# Patient Record
Sex: Male | Born: 1975
Health system: Southern US, Community
[De-identification: ages and names within clinical notes are randomized; demographics above are authoritative.]

## PROBLEM LIST (undated history)

## (undated) DIAGNOSIS — N200 Calculus of kidney: Secondary | ICD-10-CM

## (undated) DIAGNOSIS — N189 Chronic kidney disease, unspecified: Secondary | ICD-10-CM

## (undated) HISTORY — PX: GALLBLADDER SURGERY: SHX652

## (undated) HISTORY — PX: CHOLECYSTECTOMY: SHX55

## (undated) HISTORY — DX: Chronic kidney disease, unspecified: N18.9

## (undated) HISTORY — DX: Calculus of kidney: N20.0

---

## 2006-07-21 ENCOUNTER — Ambulatory Visit: Payer: Self-pay | Admitting: Family Medicine

## 2006-08-04 ENCOUNTER — Ambulatory Visit: Payer: Self-pay | Admitting: General Surgery

## 2007-06-28 ENCOUNTER — Ambulatory Visit: Payer: Self-pay | Admitting: Gastroenterology

## 2010-11-25 ENCOUNTER — Emergency Department: Payer: Self-pay | Admitting: Emergency Medicine

## 2010-12-17 ENCOUNTER — Ambulatory Visit: Payer: Self-pay

## 2010-12-31 ENCOUNTER — Ambulatory Visit: Payer: Self-pay | Admitting: Urology

## 2011-11-22 IMAGING — CT CT STONE STUDY
1 of 2 series · 15 of 32 positions shown, 19 images · non-contrast
Comparison: none

REASON FOR EXAM: left flank pain with hematuria
COMMENTS:   LMP: (Male)

[Series 2: soft tissue · axial · 0.97mm/px · z∈[-544,-46]mm · 15 of 182 slices shown, 19 images]
[im 8/182  soft-tissue]
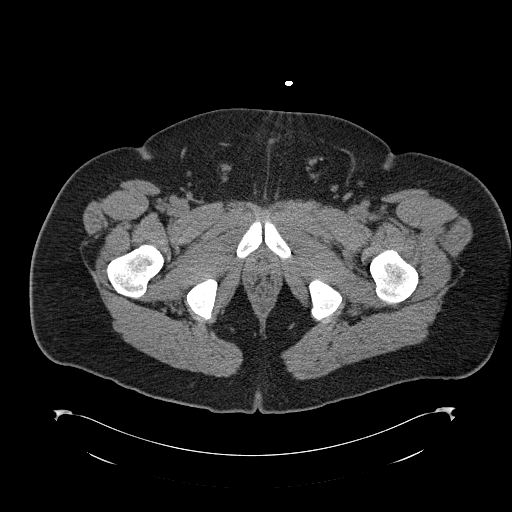
[im 8/182  bone]
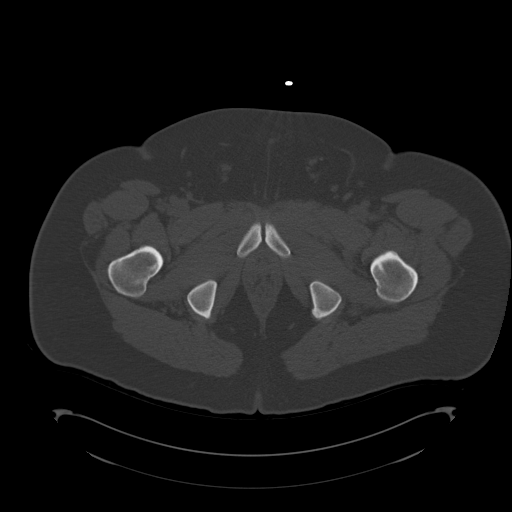
[im 24/182  soft-tissue]
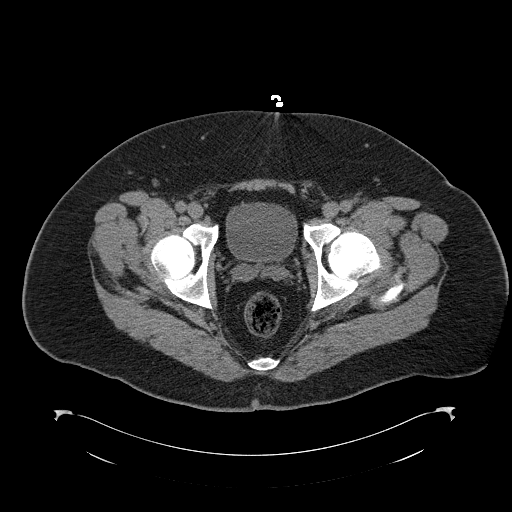
[im 40/182  soft-tissue]
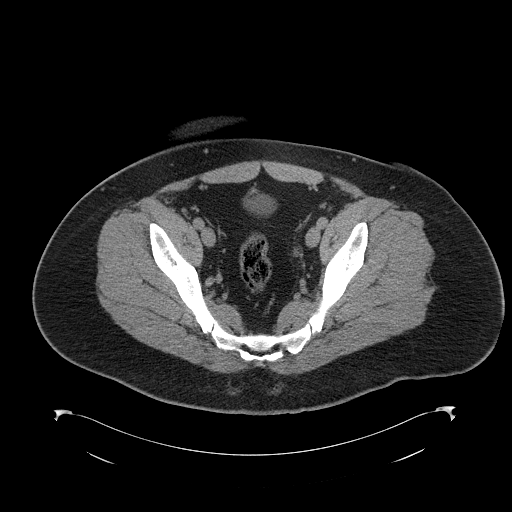
[im 48/182  soft-tissue]
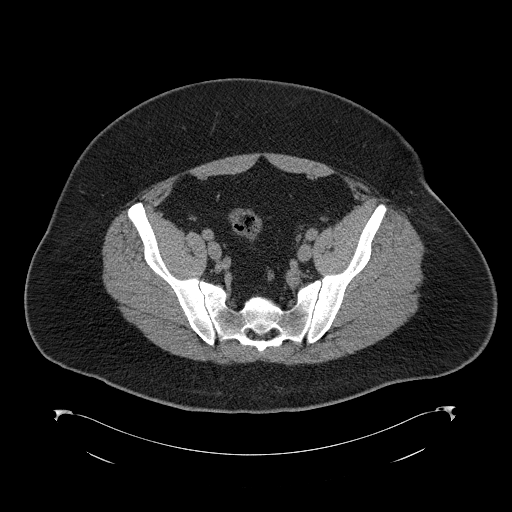
[im 63/182  soft-tissue]
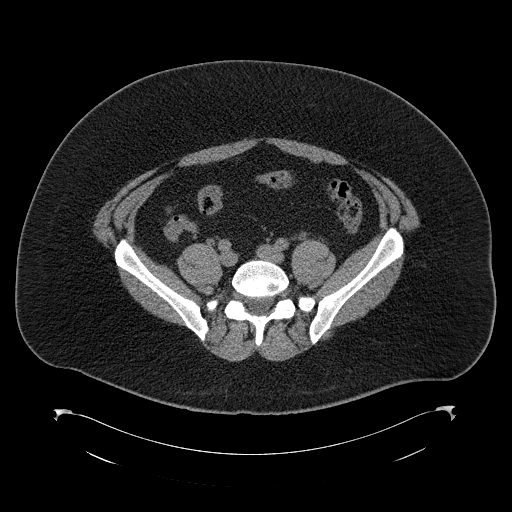
[im 79/182  soft-tissue]
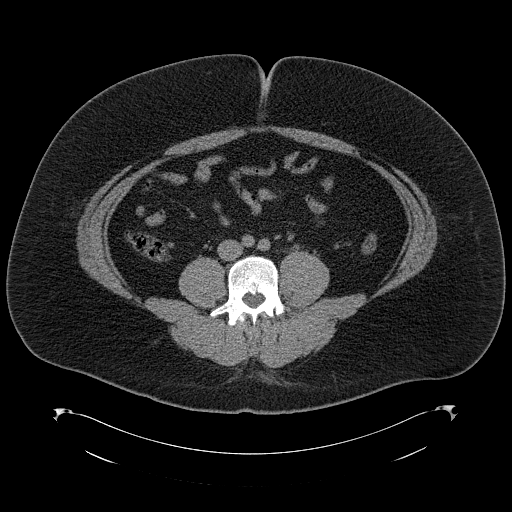
[im 95/182  soft-tissue]
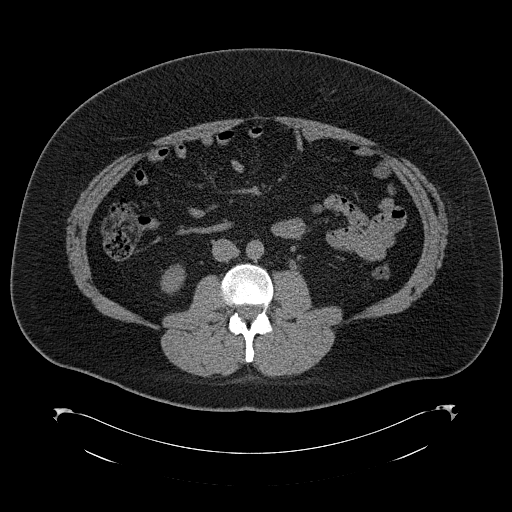
[im 103/182  soft-tissue]
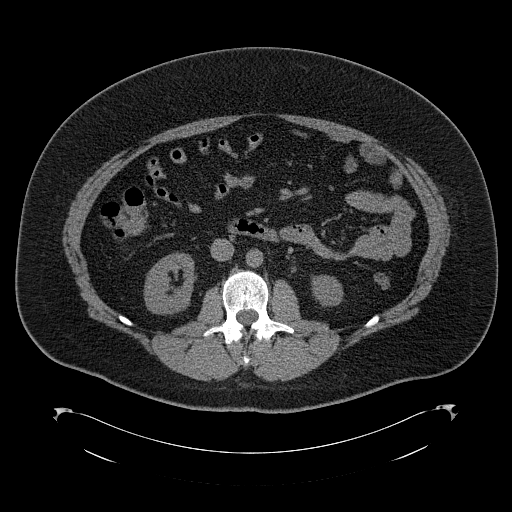
[im 119/182  soft-tissue]
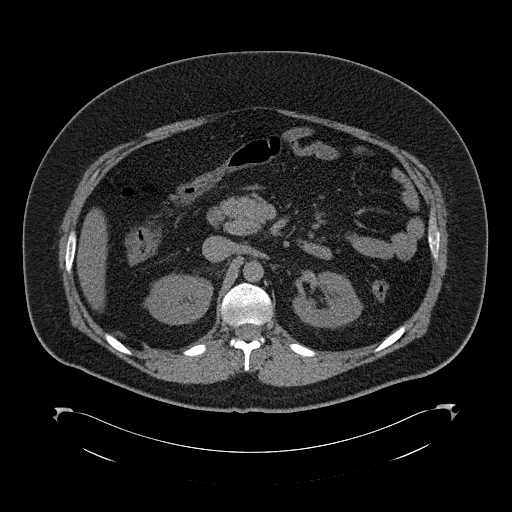
[im 119/182  bone]
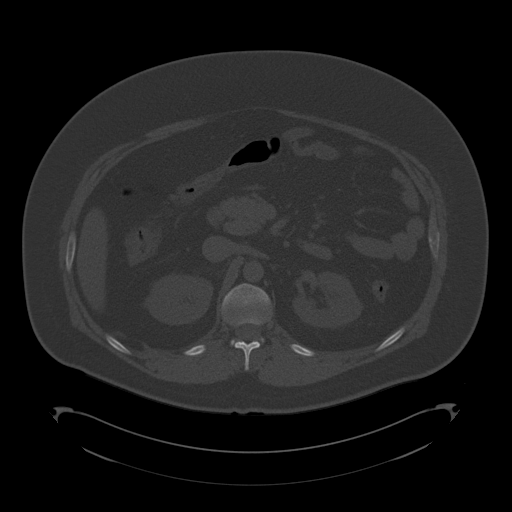
[im 134/182  soft-tissue]
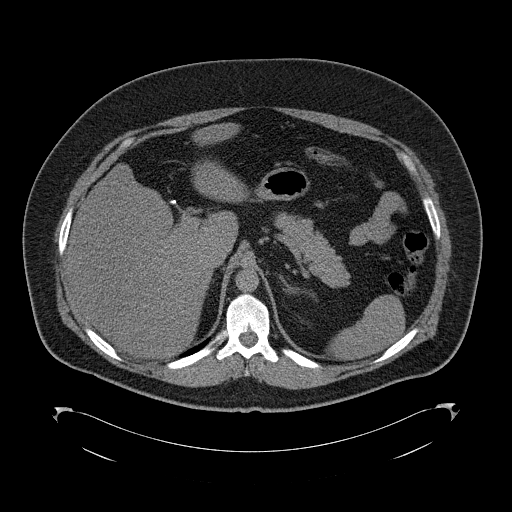
[im 142/182  soft-tissue]
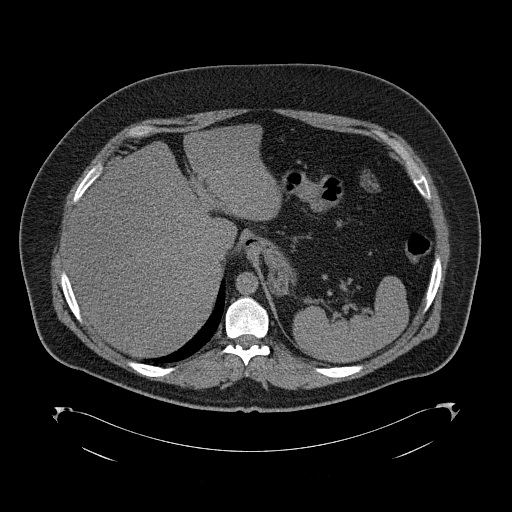
[im 150/182  lung]
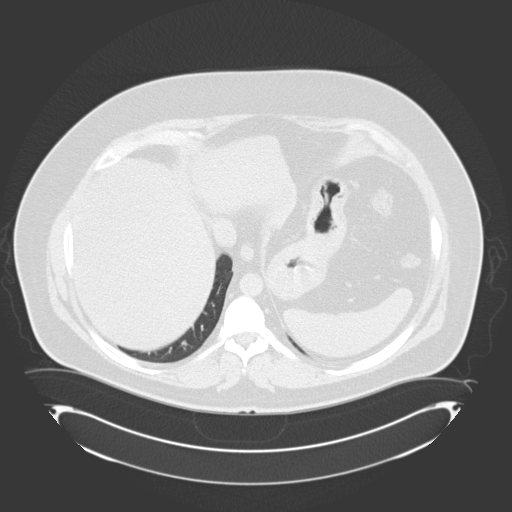
[im 158/182  soft-tissue]
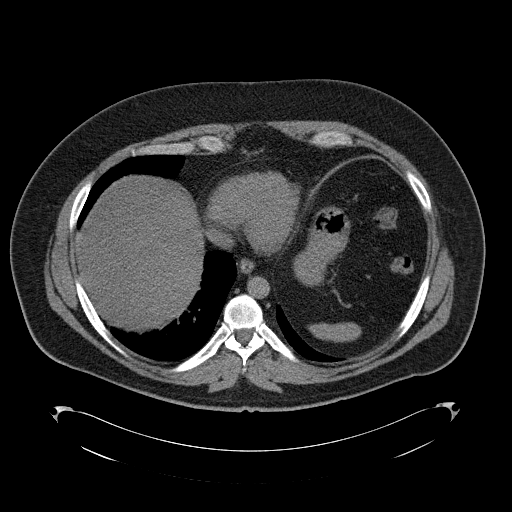
[im 158/182  lung]
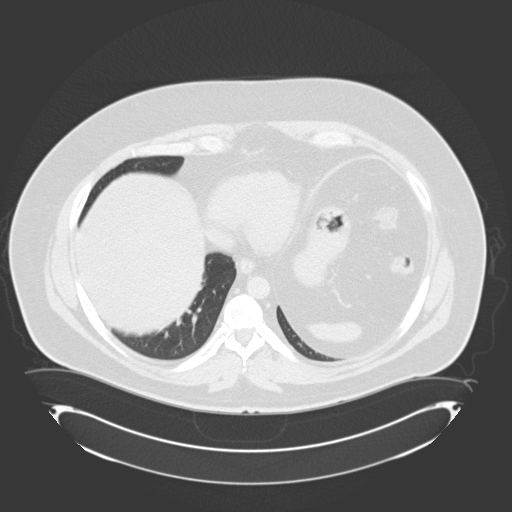
[im 166/182  lung]
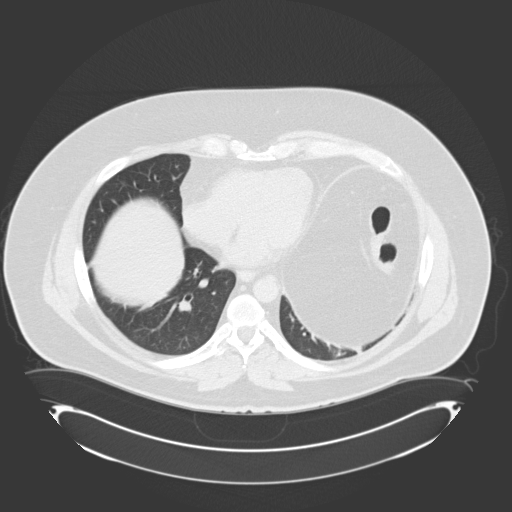
[im 174/182  soft-tissue]
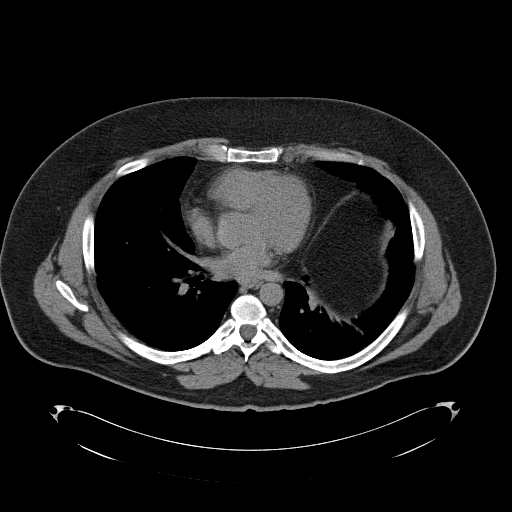
[im 174/182  lung]
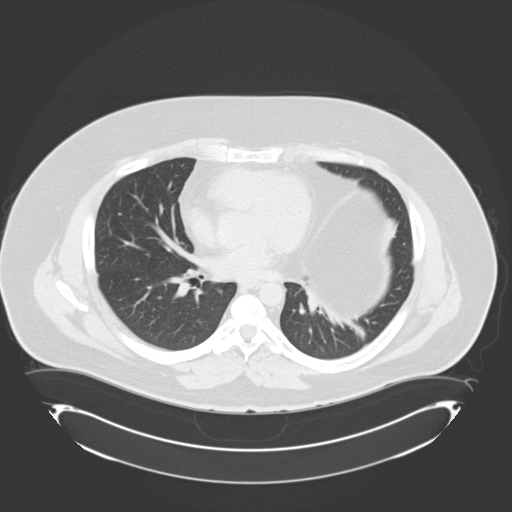

[15 of 32 positions shown; findings below may reference images not displayed]

PROCEDURE:     CT  - CT ABDOMEN /PELVIS WO (STONE)  - November 25, 2010 [DATE]

RESULT:     Axial noncontrast CT scanning was performed through the abdomen
and pelvis at 3 mm intervals and slice thicknesses. Review of multiplanar
reconstructed images was performed separately on the VIA monitor.

There is very mild hydronephrosis and hydroureter on the left secondary to a
distal ureteral stone. The stone, seen on image 149, measures 3 mm in
greatest dimension. There is a nonobstructing 2 mm diameter lower pole stone
on the left. On the right there is a 1 mm diameter nonobstructing midpole
stone. The perinephric fat is normal in density on the right and minimally
prominent on the left. The urinary bladder exhibits no evidence of stones.
The prostate gland and seminal vesicles are normal in appearance.

The gallbladder is surgically absent. The liver, pancreas, spleen, partially
distended stomach, adrenal glands, and periaortic and pericaval regions are
normal in appearance. The caliber of the abdominal aorta is normal. The
unopacified loops of small and large bowel exhibit no evidence of ileus nor
obstruction. The lumbar vertebral bodies are preserved in height. The lung
bases exhibit no acute abnormality.
IMPRESSION: 1. There is mild hydronephrosis and hydroureter on the left secondary to a 3
mm diameter distal left ureteral stone. The stone lies approximately 3 cm
from the ureterovesical junction. There are other nonobstructing stones in
both kidneys.
2. I see no acute abnormality elsewhere within the abdomen or pelvis.

## 2016-11-16 DIAGNOSIS — H1032 Unspecified acute conjunctivitis, left eye: Secondary | ICD-10-CM | POA: Diagnosis not present

## 2016-11-18 DIAGNOSIS — J019 Acute sinusitis, unspecified: Secondary | ICD-10-CM | POA: Diagnosis not present

## 2016-11-18 DIAGNOSIS — J029 Acute pharyngitis, unspecified: Secondary | ICD-10-CM | POA: Diagnosis not present

## 2016-11-21 DIAGNOSIS — B9689 Other specified bacterial agents as the cause of diseases classified elsewhere: Secondary | ICD-10-CM | POA: Diagnosis not present

## 2016-11-21 DIAGNOSIS — J019 Acute sinusitis, unspecified: Secondary | ICD-10-CM | POA: Diagnosis not present

## 2016-11-21 DIAGNOSIS — J209 Acute bronchitis, unspecified: Secondary | ICD-10-CM | POA: Diagnosis not present

## 2017-06-30 DIAGNOSIS — R05 Cough: Secondary | ICD-10-CM | POA: Diagnosis not present

## 2017-06-30 DIAGNOSIS — J019 Acute sinusitis, unspecified: Secondary | ICD-10-CM | POA: Diagnosis not present

## 2017-09-03 ENCOUNTER — Encounter: Payer: Self-pay | Admitting: Physician Assistant

## 2017-09-03 ENCOUNTER — Ambulatory Visit (INDEPENDENT_AMBULATORY_CARE_PROVIDER_SITE_OTHER): Payer: 59 | Admitting: Physician Assistant

## 2017-09-03 VITALS — BP 140/80 | HR 63 | Temp 98.3°F | Resp 16 | Ht 72.0 in | Wt 264.4 lb

## 2017-09-03 DIAGNOSIS — Z9049 Acquired absence of other specified parts of digestive tract: Secondary | ICD-10-CM | POA: Insufficient documentation

## 2017-09-03 DIAGNOSIS — Z114 Encounter for screening for human immunodeficiency virus [HIV]: Secondary | ICD-10-CM | POA: Diagnosis not present

## 2017-09-03 DIAGNOSIS — K219 Gastro-esophageal reflux disease without esophagitis: Secondary | ICD-10-CM

## 2017-09-03 DIAGNOSIS — Z87442 Personal history of urinary calculi: Secondary | ICD-10-CM | POA: Diagnosis not present

## 2017-09-03 DIAGNOSIS — Z0000 Encounter for general adult medical examination without abnormal findings: Secondary | ICD-10-CM

## 2017-09-03 DIAGNOSIS — Z13220 Encounter for screening for lipoid disorders: Secondary | ICD-10-CM | POA: Diagnosis not present

## 2017-09-03 DIAGNOSIS — Z136 Encounter for screening for cardiovascular disorders: Secondary | ICD-10-CM | POA: Diagnosis not present

## 2017-09-03 DIAGNOSIS — Z8619 Personal history of other infectious and parasitic diseases: Secondary | ICD-10-CM | POA: Insufficient documentation

## 2017-09-03 DIAGNOSIS — Z7689 Persons encountering health services in other specified circumstances: Secondary | ICD-10-CM

## 2017-09-03 DIAGNOSIS — Z125 Encounter for screening for malignant neoplasm of prostate: Secondary | ICD-10-CM | POA: Diagnosis not present

## 2017-09-03 DIAGNOSIS — Z833 Family history of diabetes mellitus: Secondary | ICD-10-CM | POA: Diagnosis not present

## 2017-09-03 MED ORDER — LANSOPRAZOLE 30 MG PO CPDR: 30.0000 mg | DELAYED_RELEASE_CAPSULE | Freq: Every day | ORAL | 1 refills | Status: DC

## 2017-09-03 NOTE — Patient Instructions (Addendum)
Tumeric 500-1000mg  daily for inflammation  Health Maintenance, Male A healthy lifestyle and preventive care is important for your health and wellness. Ask your health care provider about what schedule of regular examinations is right for you. What should I know about weight and diet? Eat a Healthy Diet  Eat plenty of vegetables, fruits, whole grains, low-fat dairy products, and lean protein.  Do not eat a lot of foods high in solid fats, added sugars, or salt.  Maintain a Healthy Weight Regular exercise can help you achieve or maintain a healthy weight. You should:  Do at least 150 minutes of exercise each week. The exercise should increase your heart rate and make you sweat (moderate-intensity exercise).  Do strength-training exercises at least twice a week.  Watch Your Levels of Cholesterol and Blood Lipids  Have your blood tested for lipids and cholesterol every 5 years starting at 44 years of age. If you are at high risk for heart disease, you should start having your blood tested when you are 44 years old. You may need to have your cholesterol levels checked more often if: ? Your lipid or cholesterol levels are high. ? You are older than 44 years of age. ? You are at high risk for heart disease.  What should I know about cancer screening? Many types of cancers can be detected early and may often be prevented. Lung Cancer  You should be screened every year for lung cancer if: ? You are a current smoker who has smoked for at least 30 years. ? You are a former smoker who has quit within the past 15 years.  Talk to your health care provider about your screening options, when you should start screening, and how often you should be screened.  Colorectal Cancer  Routine colorectal cancer screening usually begins at 44 years of age and should be repeated every 5-10 years until you are 44 years old. You may need to be screened more often if early forms of precancerous polyps or small  growths are found. Your health care provider may recommend screening at an earlier age if you have risk factors for colon cancer.  Your health care provider may recommend using home test kits to check for hidden blood in the stool.  A small camera at the end of a tube can be used to examine your colon (sigmoidoscopy or colonoscopy). This checks for the earliest forms of colorectal cancer.  Prostate and Testicular Cancer  Depending on your age and overall health, your health care provider may do certain tests to screen for prostate and testicular cancer.  Talk to your health care provider about any symptoms or concerns you have about testicular or prostate cancer.  Skin Cancer  Check your skin from head to toe regularly.  Tell your health care provider about any new moles or changes in moles, especially if: ? There is a change in a mole's size, shape, or color. ? You have a mole that is larger than a pencil eraser.  Always use sunscreen. Apply sunscreen liberally and repeat throughout the day.  Protect yourself by wearing long sleeves, pants, a wide-brimmed hat, and sunglasses when outside.  What should I know about heart disease, diabetes, and high blood pressure?  If you are 52-44 years of age, have your blood pressure checked every 3-5 years. If you are 31 years of age or older, have your blood pressure checked every year. You should have your blood pressure measured twice-once when you are at a  hospital or clinic, and once when you are not at a hospital or clinic. Record the average of the two measurements. To check your blood pressure when you are not at a hospital or clinic, you can use: ? An automated blood pressure machine at a pharmacy. ? A home blood pressure monitor.  Talk to your health care provider about your target blood pressure.  If you are between 60-56 years old, ask your health care provider if you should take aspirin to prevent heart disease.  Have regular  diabetes screenings by checking your fasting blood sugar level. ? If you are at a normal weight and have a low risk for diabetes, have this test once every three years after the age of 69. ? If you are overweight and have a high risk for diabetes, consider being tested at a younger age or more often.  A one-time screening for abdominal aortic aneurysm (AAA) by ultrasound is recommended for men aged 69-75 years who are current or former smokers. What should I know about preventing infection? Hepatitis B If you have a higher risk for hepatitis B, you should be screened for this virus. Talk with your health care provider to find out if you are at risk for hepatitis B infection. Hepatitis C Blood testing is recommended for:  Everyone born from [birth year] through 1967.  Anyone with known risk factors for hepatitis C.  Sexually Transmitted Diseases (STDs)  You should be screened each year for STDs including gonorrhea and chlamydia if: ? You are sexually active and are younger than 44 years of age. ? You are older than 44 years of age and your health care provider tells you that you are at risk for this type of infection. ? Your sexual activity has changed since you were last screened and you are at an increased risk for chlamydia or gonorrhea. Ask your health care provider if you are at risk.  Talk with your health care provider about whether you are at high risk of being infected with HIV. Your health care provider may recommend a prescription medicine to help prevent HIV infection.  What else can I do?  Schedule regular health, dental, and eye exams.  Stay current with your vaccines (immunizations).  Do not use any tobacco products, such as cigarettes, chewing tobacco, and e-cigarettes. If you need help quitting, ask your health care provider.  Limit alcohol intake to no more than 2 drinks per day. One drink equals 12 ounces of beer, 5 ounces of wine, or 1 ounces of hard liquor.  Do not use  street drugs.  Do not share needles.  Ask your health care provider for help if you need support or information about quitting drugs.  Tell your health care provider if you often feel depressed.  Tell your health care provider if you have ever been abused or do not feel safe at home. This information is not intended to replace advice given to you by your health care provider. Make sure you discuss any questions you have with your health care provider. Document Released: 12/06/2007 Document Revised: 02/06/2016 Document Reviewed: 03/13/2015 Elsevier Interactive Patient Education  2018 [employer]. Benign Positional Vertigo Vertigo is the feeling that you or your surroundings are moving when they are not. Benign positional vertigo is the most common form of vertigo. The cause of this condition is not serious (is benign). This condition is triggered by certain movements and positions (is positional). This condition can be dangerous if it occurs while you  are doing something that could endanger you or others, such as driving. What are the causes? In many cases, the cause of this condition is not known. It may be caused by a disturbance in an area of the inner ear that helps your brain to sense movement and balance. This disturbance can be caused by a viral infection (labyrinthitis), head injury, or repetitive motion. What increases the risk? This condition is more likely to develop in:  Women.  People who are 58 years of age or older.  What are the signs or symptoms? Symptoms of this condition usually happen when you move your head or your eyes in different directions. Symptoms may start suddenly, and they usually last for less than a minute. Symptoms may include:  Loss of balance and falling.  Feeling like you are spinning or moving.  Feeling like your surroundings are spinning or moving.  Nausea and vomiting.  Blurred vision.  Dizziness.  Involuntary eye movement  (nystagmus).  Symptoms can be mild and cause only slight annoyance, or they can be severe and interfere with daily life. Episodes of benign positional vertigo may return (recur) over time, and they may be triggered by certain movements. Symptoms may improve over time. How is this diagnosed? This condition is usually diagnosed by medical history and a physical exam of the head, neck, and ears. You may be referred to a health care provider who specializes in ear, nose, and throat (ENT) problems (otolaryngologist) or a provider who specializes in disorders of the nervous system (neurologist). You may have additional testing, including:  MRI.  A CT scan.  Eye movement tests. Your health care provider may ask you to change positions quickly while he or she watches you for symptoms of benign positional vertigo, such as nystagmus. Eye movement may be tested with an electronystagmogram (ENG), caloric stimulation, the Dix-Hallpike test, or the roll test.  An electroencephalogram (EEG). This records electrical activity in your brain.  Hearing tests.  How is this treated? Usually, your health care provider will treat this by moving your head in specific positions to adjust your inner ear back to normal. Surgery may be needed in severe cases, but this is rare. In some cases, benign positional vertigo may resolve on its own in 2-4 weeks. Follow these instructions at home: Safety  Move slowly.Avoid sudden body or head movements.  Avoid driving.  Avoid operating heavy machinery.  Avoid doing any tasks that would be dangerous to you or others if a vertigo episode would occur.  If you have trouble walking or keeping your balance, try using a cane for stability. If you feel dizzy or unstable, sit down right away.  Return to your normal activities as told by your health care provider. Ask your health care provider what activities are safe for you. General instructions  Take over-the-counter and  prescription medicines only as told by your health care provider.  Avoid certain positions or movements as told by your health care provider.  Drink enough fluid to keep your urine clear or pale yellow.  Keep all follow-up visits as told by your health care provider. This is important. Contact a health care provider if:  You have a fever.  Your condition gets worse or you develop new symptoms.  Your family or friends notice any behavioral changes.  Your nausea or vomiting gets worse.  You have numbness or a "pins and needles" sensation. Get help right away if:  You have difficulty speaking or moving.  You are  always dizzy.  You faint.  You develop severe headaches.  You have weakness in your legs or arms.  You have changes in your hearing or vision.  You develop a stiff neck.  You develop sensitivity to light. This information is not intended to replace advice given to you by your health care provider. Make sure you discuss any questions you have with your health care provider. Document Released: 03/17/2006 Document Revised: 11/15/2015 Document Reviewed: 10/02/2014 Elsevier Interactive Patient Education  [employer].

## 2017-09-03 NOTE — Progress Notes (Signed)
Patient: Derrick James, Male    DOB: October 12, 1975, 42 y.o.   MRN: [id number] Visit Date: 09/03/2017  Today's Provider: Mar Daring, PA-C   Chief Complaint  Patient presents with  . Establish Care  . Annual Exam   Subjective:    Annual physical exam Derrick James is a 42 y.o. male who presents today for health maintenance and complete physical. He feels well. He reports exercising none. He reports he is sleeping well.  He has not a PCP in many years. Had previously been followed by Dr. Randel Books before he retired.   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 He is married with 2 boys, 48 and 44 years old. He does report recently having some marital complications but he and his wife have been trying to work through them. They did have a period of separation for a year and a half and just recently moved back in together.  -----------------------------------------------------------------   Review of Systems  Constitutional: Negative.   HENT: Negative.   Eyes: Positive for redness and itching.  Respiratory: Negative.   Cardiovascular: Positive for palpitations.  Gastrointestinal: Negative.   Endocrine: Negative.   Genitourinary: Negative.   Musculoskeletal: Positive for arthralgias.  Skin: Negative.   Allergic/Immunologic: Negative.   Neurological: Positive for dizziness ("Occasional") and headaches.  Hematological: Negative.   Psychiatric/Behavioral: Negative.     Social History      He  reports that  has never smoked. he has never used smokeless tobacco. He reports that he does not drink alcohol or use drugs.       Social History   Socioeconomic History  . Marital status: Married    Spouse name: None  . Number of children: None  . Years of education: None  . Highest education level: None  Social Needs  . Financial resource strain: None  . Food insecurity - worry: None  . Food insecurity - inability: None  . Transportation  needs - medical: None  . Transportation needs - non-medical: None  Occupational History  . None  Tobacco Use  . Smoking status: Never Smoker  . Smokeless tobacco: Never Used  Substance and Sexual Activity  . Alcohol use: No    Frequency: Never  . Drug use: No  . Sexual activity: No  Other Topics Concern  . None  Social History Narrative  . None    Past Medical History:  Diagnosis Date  . Chronic kidney disease    stones     There are no active problems to display for this patient.   Family History        No family status information on file.        His family history is not on file.      No Known Allergies   Current Outpatient Medications:  .  Cholecalciferol (VITAMIN D PO), Take by mouth., Disp: , Rfl:    Patient Care Team: Mar Daring, PA-C as PCP - General (Family Medicine)      Objective:   Vitals: BP 140/80 (BP Location: Left Arm, Patient Position: Sitting, Cuff Size: Normal)   Pulse 63   Temp 98.3 F (36.8 C) (Oral)   Resp 16   Ht 6' (1.829 m)   Wt 264 lb 6.4 oz (119.9 kg)   BMI 35.86 kg/m    Physical Exam  Constitutional: He is oriented to person, place, and time. He appears well-developed and well-nourished.  HENT:  Head: Normocephalic and atraumatic.  Right Ear: Hearing, tympanic membrane, external ear and ear canal normal.  Left Ear: Hearing, tympanic membrane, external ear and ear canal normal.  Nose: Nose normal.  Mouth/Throat: Uvula is midline, oropharynx is clear and moist and mucous membranes are normal.  Eyes: Conjunctivae and EOM are normal. Pupils are equal, round, and reactive to light. Right eye exhibits no discharge.  Neck: Normal range of motion. Neck supple. Carotid bruit is not present. No tracheal deviation present. No thyromegaly present.  Cardiovascular: Normal rate, regular rhythm, normal heart sounds and intact distal pulses.  No murmur heard. Pulmonary/Chest: Effort normal and breath sounds normal. No  respiratory distress. He has no wheezes. He has no rales. He exhibits no tenderness.  Abdominal: Soft. Bowel sounds are normal. He exhibits no distension and no mass. There is no tenderness. There is no rebound and no guarding.  Genitourinary:  Genitourinary Comments: Deferred per patient  Musculoskeletal: Normal range of motion. He exhibits no edema or tenderness.  Lymphadenopathy:    He has no cervical adenopathy.  Neurological: He is alert and oriented to person, place, and time. He has normal reflexes. No cranial nerve deficit. He exhibits normal muscle tone. Coordination normal.  Skin: Skin is warm and dry. No rash noted. No erythema.  Psychiatric: He has a normal mood and affect. His behavior is normal. Judgment and thought content normal.  Vitals reviewed.    Depression Screen PHQ 2/9 Scores 09/03/2017  PHQ - 2 Score 1      Assessment & Plan:     Routine Health Maintenance and Physical Exam  Exercise Activities and Dietary recommendations Goals    None       There is no immunization history on file for this patient.  Health Maintenance  Topic Date Due  . HIV Screening  05/21/1989  . TETANUS/TDAP  05/21/1993  . INFLUENZA VACCINE  01/21/2017     Discussed health benefits of physical activity, and encouraged him to engage in regular exercise appropriate for his age and condition.    1. Establishing care with new doctor, encounter for  2. Annual physical exam Normal physical exam today. Will check labs as below and f/u pending lab results. If labs are stable and WNL he will not need to have these rechecked for one year at his next annual physical exam. He is to call the office in the meantime if he has any acute issue, questions or concerns. - CBC with Differential/Platelet - Comprehensive metabolic panel - Hemoglobin A1c - Lipid panel - TSH  3. Prostate cancer screening - PSA  4. Gastroesophageal reflux disease without esophagitis Worsening. Has responded  well to pepcid in the past. Will send in Rx as below. Call if no improvements. - lansoprazole (PREVACID) 30 MG capsule; Take 1 capsule (30 mg total) by mouth daily at 12 noon.  Dispense: 90 capsule; Refill: 1  5. History of Helicobacter pylori infection Found on EGD many years ago. Treated successfully.   6. History of renal stone  7. S/P cholecystectomy No complications.   8. Family history of diabetes mellitus Will check labs as below and f/u pending results. - Hemoglobin A1c  9. Encounter for lipid screening for cardiovascular disease Will check labs as below and f/u pending results. - Lipid panel  10. Encounter for screening for HIV - HIV antibody  --------------------------------------------------------------------    Mar Daring, PA-C  [facility]

## 2017-09-09 DIAGNOSIS — Z13220 Encounter for screening for lipoid disorders: Secondary | ICD-10-CM | POA: Diagnosis not present

## 2017-09-09 DIAGNOSIS — Z0000 Encounter for general adult medical examination without abnormal findings: Secondary | ICD-10-CM | POA: Diagnosis not present

## 2017-09-09 DIAGNOSIS — Z136 Encounter for screening for cardiovascular disorders: Secondary | ICD-10-CM | POA: Diagnosis not present

## 2017-09-09 DIAGNOSIS — Z833 Family history of diabetes mellitus: Secondary | ICD-10-CM | POA: Diagnosis not present

## 2017-09-10 ENCOUNTER — Telehealth: Payer: Self-pay

## 2017-09-10 LAB — COMPREHENSIVE METABOLIC PANEL
ALBUMIN: 4.5 g/dL (ref 3.5–5.5)
ALT: 19 IU/L (ref 0–44)
AST: 9 IU/L (ref 0–40)
Albumin/Globulin Ratio: 2 (ref 1.2–2.2)
Alkaline Phosphatase: 55 IU/L (ref 39–117)
BUN / CREAT RATIO: 13 (ref 9–20)
BUN: 16 mg/dL (ref 6–24)
Bilirubin Total: 1.1 mg/dL (ref 0.0–1.2)
CALCIUM: 9.7 mg/dL (ref 8.7–10.2)
CO2: 23 mmol/L (ref 20–29)
CREATININE: 1.24 mg/dL (ref 0.76–1.27)
Chloride: 103 mmol/L (ref 96–106)
GFR, EST AFRICAN AMERICAN: 82 mL/min/{1.73_m2} (ref >59)
GFR, EST NON AFRICAN AMERICAN: 71 mL/min/{1.73_m2} (ref >59)
Globulin, Total: 2.2 g/dL (ref 1.5–4.5)
Glucose: 89 mg/dL (ref 65–99)
Potassium: 4.2 mmol/L (ref 3.5–5.2)
Sodium: 142 mmol/L (ref 134–144)
TOTAL PROTEIN: 6.7 g/dL (ref 6.0–8.5)

## 2017-09-10 LAB — CBC WITH DIFFERENTIAL/PLATELET
Basophils Absolute: 0 10*3/uL (ref 0.0–0.2)
Basos: 0 %
EOS (ABSOLUTE): 0.1 10*3/uL (ref 0.0–0.4)
EOS: 1 %
HEMOGLOBIN: 16.1 g/dL (ref 13.0–17.7)
Hematocrit: 49 % (ref 37.5–51.0)
IMMATURE GRANS (ABS): 0 10*3/uL (ref 0.0–0.1)
Immature Granulocytes: 0 %
LYMPHS: 34 %
Lymphocytes Absolute: 1.9 10*3/uL (ref 0.7–3.1)
MCH: 28.4 pg (ref 26.6–33.0)
MCHC: 32.9 g/dL (ref 31.5–35.7)
MCV: 87 fL (ref 79–97)
MONOCYTES: 7 %
Monocytes Absolute: 0.4 10*3/uL (ref 0.1–0.9)
NEUTROS ABS: 3.2 10*3/uL (ref 1.4–7.0)
Neutrophils: 58 %
Platelets: 243 10*3/uL (ref 150–379)
RBC: 5.66 x10E6/uL (ref 4.14–5.80)
RDW: 14.6 % (ref 12.3–15.4)
WBC: 5.6 10*3/uL (ref 3.4–10.8)

## 2017-09-10 LAB — HIV ANTIBODY (ROUTINE TESTING W REFLEX): HIV SCREEN 4TH GENERATION: NONREACTIVE

## 2017-09-10 LAB — LIPID PANEL
CHOL/HDL RATIO: 3.9 ratio (ref 0.0–5.0)
Cholesterol, Total: 136 mg/dL (ref 100–199)
HDL: 35 mg/dL — AB (ref >39)
LDL CALC: 79 mg/dL (ref 0–99)
TRIGLYCERIDES: 112 mg/dL (ref 0–149)
VLDL Cholesterol Cal: 22 mg/dL (ref 5–40)

## 2017-09-10 LAB — HEMOGLOBIN A1C
Est. average glucose Bld gHb Est-mCnc: 103 mg/dL
HEMOGLOBIN A1C: 5.2 % (ref 4.8–5.6)

## 2017-09-10 LAB — PSA: PROSTATE SPECIFIC AG, SERUM: 0.9 ng/mL (ref 0.0–4.0)

## 2017-09-10 LAB — TSH: TSH: 2.02 u[IU]/mL (ref 0.450–4.500)

## 2017-09-10 NOTE — Telephone Encounter (Signed)
-----   Message from Mar Daring, PA-C sent at 09/10/2017  8:30 AM EDT ----- All labs are within normal limits and stable.  Thanks! -JB

## 2017-09-10 NOTE — Telephone Encounter (Signed)
Patient advised as below.  

## 2019-03-03 NOTE — Progress Notes (Signed)
Patient: Derrick James, Male    DOB: 07-May-1976, 43 y.o.   MRN: [id number] Visit Date: 03/04/2019  Today's Provider: Mar Daring, PA-C   Chief Complaint  Patient presents with  . Annual Exam   Subjective:     Annual physical exam Derrick James is a 43 y.o. male who presents today for health maintenance and complete physical. He feels fairly well. He reports exercising yes. He reports he is sleeping fairly well. -----------------------------------------------------------   Review of Systems  Constitutional: Positive for fatigue.  HENT: Negative.   Eyes: Negative.   Respiratory: Negative.   Cardiovascular: Negative.   Gastrointestinal: Negative.   Endocrine: Negative.   Genitourinary: Negative.   Musculoskeletal: Positive for back pain.  Skin: Negative.   Allergic/Immunologic: Negative.   Neurological: Negative.   Hematological: Negative.   Psychiatric/Behavioral: Negative.     Social History      He  reports that he has never smoked. He has never used smokeless tobacco. He reports that he does not drink alcohol or use drugs.       Social History   Socioeconomic History  . Marital status: Married    Spouse name: Not on file  . Number of children: Not on file  . Years of education: Not on file  . Highest education level: Not on file  Occupational History  . Not on file  Social Needs  . Financial resource strain: Not on file  . Food insecurity    Worry: Not on file    Inability: Not on file  . Transportation needs    Medical: Not on file    Non-medical: Not on file  Tobacco Use  . Smoking status: Never Smoker  . Smokeless tobacco: Never Used  Substance and Sexual Activity  . Alcohol use: No    Frequency: Never  . Drug use: No  . Sexual activity: Never  Lifestyle  . Physical activity    Days per week: Not on file    Minutes per session: Not on file  . Stress: Not on file  Relationships  . Social Herbalist on phone: Not  on file    Gets together: Not on file    Attends religious service: Not on file    Active member of club or organization: Not on file    Attends meetings of clubs or organizations: Not on file    Relationship status: Not on file  Other Topics Concern  . Not on file  Social History Narrative  . Not on file    Past Medical History:  Diagnosis Date  . Chronic kidney disease    stones     Patient Active Problem List   Diagnosis Date Noted  . History of Helicobacter pylori infection 09/03/2017  . History of renal stone 09/03/2017  . S/P cholecystectomy 09/03/2017    Past Surgical History:  Procedure Laterality Date  . CHOLECYSTECTOMY      Family History        No family status information on file.        His family history is not on file.      No Known Allergies   Current Outpatient Medications:  .  Cholecalciferol (VITAMIN D PO), Take by mouth., Disp: , Rfl:  .  lansoprazole (PREVACID) 30 MG capsule, Take 1 capsule (30 mg total) by mouth daily at 12 noon. (Patient not taking: Reported on 03/04/2019), Disp: 90 capsule, Rfl: 1   Patient  Care Team: Rubye Beach as PCP - General (Family Medicine)    Objective:    Vitals: BP 123/88 (BP Location: Right Arm, Patient Position: Sitting, Cuff Size: Large)   Pulse 92   Temp (!) 96.9 F (36.1 C) (Other (Comment))   Resp 16   Ht 6' (1.829 m)   Wt 278 lb (126.1 kg)   SpO2 97%   BMI 37.70 kg/m    Vitals:   03/04/19 1018  BP: 123/88  Pulse: 92  Resp: 16  Temp: (!) 96.9 F (36.1 C)  TempSrc: Other (Comment)  SpO2: 97%  Weight: 278 lb (126.1 kg)  Height: 6' (1.829 m)     Physical Exam Vitals signs reviewed.  Constitutional:      General: He is not in acute distress.    Appearance: Normal appearance. He is well-developed. He is obese. He is not ill-appearing.  HENT:     Head: Normocephalic and atraumatic.     Right Ear: Tympanic membrane, ear canal and external ear normal.     Left Ear:  Tympanic membrane, ear canal and external ear normal.     Nose: Nose normal.     Mouth/Throat:     Mouth: Mucous membranes are moist.  Eyes:     General: No scleral icterus.       Right eye: No discharge.        Left eye: No discharge.     Extraocular Movements: Extraocular movements intact.     Conjunctiva/sclera: Conjunctivae normal.     Pupils: Pupils are equal, round, and reactive to light.  Neck:     Musculoskeletal: Normal range of motion and neck supple.     Thyroid: No thyromegaly.     Vascular: No carotid bruit.     Trachea: No tracheal deviation.  Cardiovascular:     Rate and Rhythm: Normal rate and regular rhythm.     Pulses: Normal pulses.     Heart sounds: Normal heart sounds. No murmur.  Pulmonary:     Effort: Pulmonary effort is normal. No respiratory distress.     Breath sounds: Normal breath sounds. No wheezing or rales.  Chest:     Chest wall: No tenderness.  Abdominal:     General: Abdomen is protuberant. Bowel sounds are normal. There is no distension.     Palpations: Abdomen is soft. There is no mass.     Tenderness: There is no abdominal tenderness. There is no guarding or rebound.  Musculoskeletal: Normal range of motion.        General: No tenderness.     Right lower leg: No edema.     Left lower leg: No edema.  Lymphadenopathy:     Cervical: No cervical adenopathy.  Skin:    General: Skin is warm and dry.     Capillary Refill: Capillary refill takes less than 2 seconds.     Findings: No erythema or rash.  Neurological:     General: No focal deficit present.     Mental Status: He is alert and oriented to person, place, and time. Mental status is at baseline.     Cranial Nerves: No cranial nerve deficit.     Motor: No weakness or abnormal muscle tone.     Coordination: Coordination normal.     Gait: Gait normal.     Deep Tendon Reflexes: Reflexes are normal and symmetric. Reflexes normal.  Psychiatric:        Mood and Affect: Mood normal.  Behavior: Behavior normal.        Thought Content: Thought content normal.        Judgment: Judgment normal.      Depression Screen PHQ 2/9 Scores 03/04/2019 09/03/2017  PHQ - 2 Score 2 1  PHQ- 9 Score 3 -       Assessment & Plan:     Routine Health Maintenance and Physical Exam  Exercise Activities and Dietary recommendations Goals   None      There is no immunization history on file for this patient.  Health Maintenance  Topic Date Due  . TETANUS/TDAP  05/21/1993  . INFLUENZA VACCINE  01/22/2019  . HIV Screening  Completed     Discussed health benefits of physical activity, and encouraged him to engage in regular exercise appropriate for his age and condition.    1. Annual physical exam Normal physical exam today. Will check labs as below and f/u pending lab results. If labs are stable and WNL he will not need to have these rechecked for one year at his next annual physical exam. He is to call the office in the meantime if he has any acute issue, questions or concerns. - CBC w/Diff/Platelet - Comprehensive Metabolic Panel (CMET) - TSH - Lipid Profile - HgB A1c  2. Colon cancer screening Will be 76 in November. Father has history of polyps that required colon resection per patient. Referral placed.  - Ambulatory referral to Gastroenterology  3. Prostate cancer screening No urinary issues. Will check labs as below and f/u pending results. - PSA  4. Class 2 severe obesity due to excess calories with serious comorbidity and body mass index (BMI) of 37.0 to 37.9 in adult [facility]) Counseled patient on healthy lifestyle modifications including dieting and exercise.  Started program for weight loss last week and has lost 9 pounds since starting.  - Comprehensive Metabolic Panel (CMET) - Lipid Profile - HgB A1c  5. Need for Tdap vaccination Tdap Vaccine given to patient without complications. Patient sat for 15 minutes after administration and was tolerated well  without adverse effects. - Tdap vaccine greater than or equal to 7yo IM  --------------------------------------------------------------------    Mar Daring, PA-C  [facility]

## 2019-03-04 ENCOUNTER — Encounter: Payer: Self-pay | Admitting: Physician Assistant

## 2019-03-04 ENCOUNTER — Ambulatory Visit (INDEPENDENT_AMBULATORY_CARE_PROVIDER_SITE_OTHER): Payer: BC Managed Care – PPO | Admitting: Physician Assistant

## 2019-03-04 ENCOUNTER — Other Ambulatory Visit: Payer: Self-pay

## 2019-03-04 VITALS — BP 123/88 | HR 92 | Temp 96.9°F | Resp 16 | Ht 72.0 in | Wt 278.0 lb

## 2019-03-04 DIAGNOSIS — Z125 Encounter for screening for malignant neoplasm of prostate: Secondary | ICD-10-CM

## 2019-03-04 DIAGNOSIS — Z0000 Encounter for general adult medical examination without abnormal findings: Secondary | ICD-10-CM | POA: Diagnosis not present

## 2019-03-04 DIAGNOSIS — Z23 Encounter for immunization: Secondary | ICD-10-CM

## 2019-03-04 DIAGNOSIS — Z6837 Body mass index (BMI) 37.0-37.9, adult: Secondary | ICD-10-CM

## 2019-03-04 DIAGNOSIS — Z1211 Encounter for screening for malignant neoplasm of colon: Secondary | ICD-10-CM

## 2019-03-04 NOTE — Patient Instructions (Signed)

## 2019-03-05 LAB — COMPREHENSIVE METABOLIC PANEL
ALT: 29 IU/L (ref 0–44)
AST: 13 IU/L (ref 0–40)
Albumin/Globulin Ratio: 2.4 — ABNORMAL HIGH (ref 1.2–2.2)
Albumin: 5 g/dL (ref 4.0–5.0)
Alkaline Phosphatase: 57 IU/L (ref 39–117)
BUN/Creatinine Ratio: 14 (ref 9–20)
BUN: 19 mg/dL (ref 6–24)
Bilirubin Total: 1 mg/dL (ref 0.0–1.2)
CO2: 20 mmol/L (ref 20–29)
Calcium: 10 mg/dL (ref 8.7–10.2)
Chloride: 100 mmol/L (ref 96–106)
Creatinine, Ser: 1.34 mg/dL — ABNORMAL HIGH (ref 0.76–1.27)
GFR calc Af Amer: 74 mL/min/{1.73_m2} (ref >59)
GFR calc non Af Amer: 64 mL/min/{1.73_m2} (ref >59)
Globulin, Total: 2.1 g/dL (ref 1.5–4.5)
Glucose: 80 mg/dL (ref 65–99)
Potassium: 4.7 mmol/L (ref 3.5–5.2)
Sodium: 138 mmol/L (ref 134–144)
Total Protein: 7.1 g/dL (ref 6.0–8.5)

## 2019-03-05 LAB — CBC WITH DIFFERENTIAL/PLATELET
Basophils Absolute: 0.1 10*3/uL (ref 0.0–0.2)
Basos: 1 %
EOS (ABSOLUTE): 0 10*3/uL (ref 0.0–0.4)
Eos: 1 %
Hematocrit: 52.7 % — ABNORMAL HIGH (ref 37.5–51.0)
Hemoglobin: 17.1 g/dL (ref 13.0–17.7)
Immature Grans (Abs): 0 10*3/uL (ref 0.0–0.1)
Immature Granulocytes: 1 %
Lymphocytes Absolute: 1.6 10*3/uL (ref 0.7–3.1)
Lymphs: 21 %
MCH: 27.9 pg (ref 26.6–33.0)
MCHC: 32.4 g/dL (ref 31.5–35.7)
MCV: 86 fL (ref 79–97)
Monocytes Absolute: 0.5 10*3/uL (ref 0.1–0.9)
Monocytes: 7 %
Neutrophils Absolute: 5.3 10*3/uL (ref 1.4–7.0)
Neutrophils: 69 %
Platelets: 285 10*3/uL (ref 150–450)
RBC: 6.13 x10E6/uL — ABNORMAL HIGH (ref 4.14–5.80)
RDW: 13.6 % (ref 11.6–15.4)
WBC: 7.5 10*3/uL (ref 3.4–10.8)

## 2019-03-05 LAB — HEMOGLOBIN A1C
Est. average glucose Bld gHb Est-mCnc: 103 mg/dL
Hgb A1c MFr Bld: 5.2 % (ref 4.8–5.6)

## 2019-03-05 LAB — LIPID PANEL
Chol/HDL Ratio: 4.2 ratio (ref 0.0–5.0)
Cholesterol, Total: 127 mg/dL (ref 100–199)
HDL: 30 mg/dL — ABNORMAL LOW (ref >39)
LDL Chol Calc (NIH): 80 mg/dL (ref 0–99)
Triglycerides: 89 mg/dL (ref 0–149)
VLDL Cholesterol Cal: 17 mg/dL (ref 5–40)

## 2019-03-05 LAB — TSH: TSH: 1.63 u[IU]/mL (ref 0.450–4.500)

## 2019-03-05 LAB — PSA: Prostate Specific Ag, Serum: 0.8 ng/mL (ref 0.0–4.0)

## 2019-03-07 ENCOUNTER — Telehealth: Payer: Self-pay

## 2019-03-07 NOTE — Telephone Encounter (Signed)
-----   Message from Mar Daring, PA-C sent at 03/05/2019  2:37 PM EDT ----- Blood count is normal. Kidney function is slightly decreased. This could be from dehydration. Make sure to push fluids. Since you have just recently started those supplements, we should most likely recheck in 6-8 weeks to make sure this is not something coming from those. Sodium, potassium and calcium are normal. Thyroid is normal. Cholesterol is normal. A1c/suagr are normal. PSA is normal.

## 2019-03-07 NOTE — Telephone Encounter (Signed)
Patient advised as directed below. 

## 2019-03-18 ENCOUNTER — Encounter: Payer: Self-pay | Admitting: *Deleted

## 2019-03-24 NOTE — Progress Notes (Signed)
Patient: Derrick James Male    DOB: [birth year]   43 y.o.   MRN: [id number] Visit Date: 03/25/2019  Today's Provider: Mar Daring, PA-C   Chief Complaint  Patient presents with  . Nevus   Subjective:     HPI   Patient is here to discuss moles/skin tags on his chest area. Requesting removal. Has a few that catch or get hit and keep getting larger.   No Known Allergies   Current Outpatient Medications:  .  Cholecalciferol (VITAMIN D PO), Take by mouth., Disp: , Rfl:  .  lansoprazole (PREVACID) 30 MG capsule, Take 1 capsule (30 mg total) by mouth daily at 12 noon. (Patient not taking: Reported on 03/04/2019), Disp: 90 capsule, Rfl: 1  Review of Systems  Constitutional: Negative for appetite change, chills and fever.  Respiratory: Negative for chest tightness, shortness of breath and wheezing.   Cardiovascular: Negative for chest pain and palpitations.  Gastrointestinal: Negative for abdominal pain, nausea and vomiting.    Social History   Tobacco Use  . Smoking status: Never Smoker  . Smokeless tobacco: Never Used  Substance Use Topics  . Alcohol use: No    Frequency: Never      Objective:   BP 125/88 (BP Location: Right Arm, Patient Position: Sitting, Cuff Size: Large)   Pulse 90   Temp (!) 97.3 F (36.3 C) (Other (Comment))   Resp 16   Ht 6' (1.829 m)   Wt 268 lb (121.6 kg)   SpO2 96%   BMI 36.35 kg/m  Vitals:   03/25/19 1116  BP: 125/88  Pulse: 90  Resp: 16  Temp: (!) 97.3 F (36.3 C)  TempSrc: Other (Comment)  SpO2: 96%  Weight: 268 lb (121.6 kg)  Height: 6' (1.829 m)  Body mass index is 36.35 kg/m.   Physical Exam Vitals signs reviewed.  Constitutional:      General: He is not in acute distress.    Appearance: Normal appearance. He is well-developed. He is not diaphoretic.  HENT:     Head: Normocephalic and atraumatic.  Neck:     Musculoskeletal: Normal range of motion and neck supple.   Cardiovascular:     Rate and  Rhythm: Normal rate and regular rhythm.     Heart sounds: Normal heart sounds. No murmur. No friction rub. No gallop.   Pulmonary:     Effort: Pulmonary effort is normal. No respiratory distress.     Breath sounds: Normal breath sounds. No wheezing or rales.  Chest:    Abdominal:    Neurological:     Mental Status: He is alert.      No results found for any visits on 03/25/19.     Assessment & Plan    1. Cherry angioma 1 total; All 4 skin lesions were removed as noted below in procedure note. Areas were cleaned and dry dressings applied. Call if signs of infection or other issue develop.   2. Accessory skin tags 2 total. See above medical treatment plan.  3. Nevus 1 total; see above.  4. Need for influenza vaccination Flu vaccine given today without complication. Patient sat upright for 15 minutes to check for adverse reaction before being released. - Flu Vaccine QUAD 36+ mos IM  Procedure Note: Procedure, benefits, risk (including those of bleeding, infection, injury, allergic reaction) and alternatives were explained to the patient who voiced understanding of the information. Questions were sought and answered. The patient agreed to proceed  with the skin tag removal. Verbal consent was obtained.  The 4 areas were prepped with Betadine swab and draped in a sterile fashion. Local anesthetic was administered with 0.5cc of 2% Xylocaine with epinephrine to each lesion for a total of 2cc. The skin lesions were then removed using a size 11 scalpel blade. There was minimal bleeding. Hemostasis was intact. The wound was then cleaned and a dry dressing placed. There were no complications.     Mar Daring, PA-C  [facility]

## 2019-03-25 ENCOUNTER — Ambulatory Visit: Payer: BC Managed Care – PPO | Admitting: Physician Assistant

## 2019-03-25 ENCOUNTER — Other Ambulatory Visit: Payer: Self-pay

## 2019-03-25 ENCOUNTER — Encounter: Payer: Self-pay | Admitting: Physician Assistant

## 2019-03-25 VITALS — BP 125/88 | HR 90 | Temp 97.3°F | Resp 16 | Ht 72.0 in | Wt 268.0 lb

## 2019-03-25 DIAGNOSIS — D229 Melanocytic nevi, unspecified: Secondary | ICD-10-CM | POA: Diagnosis not present

## 2019-03-25 DIAGNOSIS — Z23 Encounter for immunization: Secondary | ICD-10-CM

## 2019-03-25 DIAGNOSIS — Q828 Other specified congenital malformations of skin: Secondary | ICD-10-CM

## 2019-03-25 DIAGNOSIS — D1801 Hemangioma of skin and subcutaneous tissue: Secondary | ICD-10-CM

## 2019-03-30 ENCOUNTER — Encounter: Payer: Self-pay | Admitting: Physician Assistant

## 2019-09-10 ENCOUNTER — Ambulatory Visit: Payer: Self-pay | Attending: Internal Medicine

## 2019-09-10 DIAGNOSIS — Z23 Encounter for immunization: Secondary | ICD-10-CM

## 2019-09-10 NOTE — Progress Notes (Signed)
   Covid-19 Vaccination Clinic  Name:  Derrick James    MRN: [id number] DOB: 1975/12/02  09/10/2019  [name] was observed post Covid-19 immunization for 15 minutes without incident. He was provided with Vaccine Information Sheet and instruction to access the V-Safe system.   [name] was instructed to call 911 with any severe reactions post vaccine: Marland Kitchen Difficulty breathing  . Swelling of face and throat  . A fast heartbeat  . A bad rash all over body  . Dizziness and weakness   Immunizations Administered    Name Date Dose VIS Date Route   Pfizer COVID-19 Vaccine 09/10/2019  9:23 AM 0.3 mL 06/03/2019 Intramuscular   Manufacturer: [employer]   Lot: [id number]   [place]: [id number]

## 2019-10-04 ENCOUNTER — Ambulatory Visit: Payer: Self-pay | Attending: Internal Medicine

## 2019-10-04 DIAGNOSIS — Z23 Encounter for immunization: Secondary | ICD-10-CM

## 2019-10-04 NOTE — Progress Notes (Signed)
   Covid-19 Vaccination Clinic  Name:  Derrick James    MRN: [id number] DOB: 01/02/1976  10/04/2019  Derrick James was observed post Covid-19 immunization for 15 minutes without incident. He was provided with Vaccine Information Sheet and instruction to access the V-Safe system.   Derrick James was instructed to call 911 with any severe reactions post vaccine: Marland Kitchen Difficulty breathing  . Swelling of face and throat  . A fast heartbeat  . A bad rash all over body  . Dizziness and weakness   Immunizations Administered    Name Date Dose VIS Date Route   Pfizer COVID-19 Vaccine 10/04/2019 12:47 PM 0.3 mL 06/03/2019 Intramuscular   Manufacturer: [employer]   Lot: [id number]   [place]: [id number]

## 2019-11-26 DIAGNOSIS — Z20822 Contact with and (suspected) exposure to covid-19: Secondary | ICD-10-CM | POA: Diagnosis not present

## 2020-03-05 ENCOUNTER — Other Ambulatory Visit: Payer: Self-pay

## 2020-03-05 ENCOUNTER — Encounter: Payer: Self-pay | Admitting: Physician Assistant

## 2020-03-05 ENCOUNTER — Ambulatory Visit (INDEPENDENT_AMBULATORY_CARE_PROVIDER_SITE_OTHER): Payer: BC Managed Care – PPO | Admitting: Physician Assistant

## 2020-03-05 VITALS — BP 126/81 | HR 74 | Temp 98.5°F | Resp 16 | Ht 72.0 in | Wt 215.2 lb

## 2020-03-05 DIAGNOSIS — Z1159 Encounter for screening for other viral diseases: Secondary | ICD-10-CM | POA: Diagnosis not present

## 2020-03-05 DIAGNOSIS — Z0000 Encounter for general adult medical examination without abnormal findings: Secondary | ICD-10-CM | POA: Diagnosis not present

## 2020-03-05 DIAGNOSIS — Z23 Encounter for immunization: Secondary | ICD-10-CM

## 2020-03-05 DIAGNOSIS — Z6829 Body mass index (BMI) 29.0-29.9, adult: Secondary | ICD-10-CM

## 2020-03-05 DIAGNOSIS — Z1329 Encounter for screening for other suspected endocrine disorder: Secondary | ICD-10-CM | POA: Diagnosis not present

## 2020-03-05 DIAGNOSIS — G8929 Other chronic pain: Secondary | ICD-10-CM

## 2020-03-05 DIAGNOSIS — Z1211 Encounter for screening for malignant neoplasm of colon: Secondary | ICD-10-CM

## 2020-03-05 DIAGNOSIS — M546 Pain in thoracic spine: Secondary | ICD-10-CM

## 2020-03-05 DIAGNOSIS — Z125 Encounter for screening for malignant neoplasm of prostate: Secondary | ICD-10-CM

## 2020-03-05 NOTE — Patient Instructions (Signed)

## 2020-03-05 NOTE — Progress Notes (Signed)
Complete physical exam   Patient: Derrick James   DOB: 06/10/1976   44 y.o. Male  MRN: [id number] Visit Date: 03/05/2020  Today's healthcare provider: Mar Daring, PA-C   Chief Complaint  Patient presents with  . Annual Exam   Subjective    Derrick James is a 44 y.o. male who presents today for a complete physical exam.  He reports consuming a well balanced and low carbs diet. Home exercise routine includes weight, low impact cardio, walks during lunch, ride the bike. He generally feels well. He reports sleeping well. He does have additional problems to discuss today.  HPI  Low back pain, for a few month or year off an on. It just feels like he needs to pop something back.   Past Medical History:  Diagnosis Date  . Chronic kidney disease    stones   Past Surgical History:  Procedure Laterality Date  . CHOLECYSTECTOMY     Social History   Socioeconomic History  . Marital status: Married    Spouse name: Not on file  . Number of children: Not on file  . Years of education: Not on file  . Highest education level: Not on file  Occupational History  . Not on file  Tobacco Use  . Smoking status: Never Smoker  . Smokeless tobacco: Never Used  Substance and Sexual Activity  . Alcohol use: No  . Drug use: No  . Sexual activity: Never  Other Topics Concern  . Not on file  Social History Narrative  . Not on file   Social Determinants of Health   Financial Resource Strain:   . Difficulty of Paying Living Expenses: Not on file  Food Insecurity:   . Worried About Charity fundraiser in the Last Year: Not on file  . Ran Out of Food in the Last Year: Not on file  Transportation Needs:   . Lack of Transportation (Medical): Not on file  . Lack of Transportation (Non-Medical): Not on file  Physical Activity:   . Days of Exercise per Week: Not on file  . Minutes of Exercise per Session: Not on file  Stress:   . Feeling of Stress : Not on file  Social  Connections:   . Frequency of Communication with Friends and Family: Not on file  . Frequency of Social Gatherings with Friends and Family: Not on file  . Attends Religious Services: Not on file  . Active Member of Clubs or Organizations: Not on file  . Attends Archivist Meetings: Not on file  . Marital Status: Not on file  Intimate Partner Violence:   . Fear of Current or Ex-Partner: Not on file  . Emotionally Abused: Not on file  . Physically Abused: Not on file  . Sexually Abused: Not on file   No family status information on file.   History reviewed. No pertinent family history. Allergies  Allergen Reactions  . Codeine Nausea And Vomiting and Nausea Only  . Fluticasone-Salmeterol     Other reaction(s): Headache  . Promethazine     Other reaction(s): Dizziness    Patient Care Team: Rubye Beach as PCP - General (Family Medicine)   Medications: Outpatient Medications Prior to Visit  Medication Sig  . Cholecalciferol (VITAMIN D PO) Take by mouth.  . lansoprazole (PREVACID) 30 MG capsule Take 1 capsule (30 mg total) by mouth daily at 12 noon. (Patient not taking: Reported on 03/04/2019)   No facility-administered  medications prior to visit.    Review of Systems  Constitutional: Negative.   HENT: Negative.   Eyes: Negative.   Respiratory: Negative.   Cardiovascular: Negative.   Gastrointestinal: Negative.   Endocrine: Negative.   Genitourinary: Negative.   Musculoskeletal: Positive for back pain.  Skin: Negative.   Allergic/Immunologic: Negative.   Neurological: Negative.   Hematological: Negative.   Psychiatric/Behavioral: Negative.     Last CBC Lab Results  Component Value Date   WBC 3.9 03/05/2020   HGB 16.1 03/05/2020   HCT 48.3 03/05/2020   MCV 88 03/05/2020   MCH 29.3 03/05/2020   RDW 12.8 03/05/2020   PLT 199 12/87/8676   Last metabolic panel Lab Results  Component Value Date   GLUCOSE 102 (H) 03/05/2020   NA 141  03/05/2020   K 4.3 03/05/2020   CL 104 03/05/2020   CO2 24 03/05/2020   BUN 18 03/05/2020   CREATININE 1.11 03/05/2020   GFRNONAA 80 03/05/2020   GFRAA 92 03/05/2020   CALCIUM 9.5 03/05/2020   PROT 6.4 03/05/2020   ALBUMIN 4.6 03/05/2020   LABGLOB 1.8 03/05/2020   AGRATIO 2.6 (H) 03/05/2020   BILITOT 0.9 03/05/2020   ALKPHOS 48 03/05/2020   AST 11 03/05/2020   ALT 29 03/05/2020      Objective    BP 126/81 (BP Location: Left Arm, Patient Position: Sitting, Cuff Size: Large)   Pulse 74   Temp 98.5 F (36.9 C) (Oral)   Resp 16   Ht 6' (1.829 m)   Wt 215 lb 3.2 oz (97.6 kg)   BMI 29.19 kg/m  BP Readings from Last 3 Encounters:  03/05/20 126/81  03/25/19 125/88  03/04/19 123/88   Wt Readings from Last 3 Encounters:  03/05/20 215 lb 3.2 oz (97.6 kg)  03/25/19 268 lb (121.6 kg)  03/04/19 278 lb (126.1 kg)      Physical Exam Constitutional:      General: He is not in acute distress.    Appearance: Normal appearance. He is well-developed. He is obese. He is not ill-appearing.  HENT:     Head: Normocephalic and atraumatic.     Right Ear: Tympanic membrane, ear canal and external ear normal.     Left Ear: Tympanic membrane, ear canal and external ear normal.  Eyes:     General:        Right eye: No discharge.        Left eye: No discharge.     Extraocular Movements: Extraocular movements intact.     Conjunctiva/sclera: Conjunctivae normal.     Pupils: Pupils are equal, round, and reactive to light.  Neck:     Thyroid: No thyromegaly.     Trachea: No tracheal deviation.  Cardiovascular:     Rate and Rhythm: Normal rate and regular rhythm.     Pulses: Normal pulses.     Heart sounds: Normal heart sounds. No murmur heard.   Pulmonary:     Effort: Pulmonary effort is normal. No respiratory distress.     Breath sounds: Normal breath sounds. No wheezing or rales.  Chest:     Chest wall: No tenderness.  Abdominal:     General: Abdomen is flat. Bowel sounds are  normal. There is no distension.     Palpations: Abdomen is soft. There is no mass.     Tenderness: There is no abdominal tenderness. There is no guarding or rebound.  Musculoskeletal:        General: No tenderness. Normal range  of motion.     Cervical back: Normal range of motion and neck supple.  Lymphadenopathy:     Cervical: No cervical adenopathy.  Skin:    General: Skin is warm and dry.     Capillary Refill: Capillary refill takes less than 2 seconds.     Findings: No erythema or rash.  Neurological:     General: No focal deficit present.     Mental Status: He is alert and oriented to person, place, and time. Mental status is at baseline.     Cranial Nerves: No cranial nerve deficit.     Motor: No abnormal muscle tone.     Coordination: Coordination normal.     Deep Tendon Reflexes: Reflexes are normal and symmetric. Reflexes normal.  Psychiatric:        Mood and Affect: Mood normal.        Behavior: Behavior normal.        Thought Content: Thought content normal.        Judgment: Judgment normal.      Last depression screening scores PHQ 2/9 Scores 03/05/2020 03/04/2019 09/03/2017  PHQ - 2 Score 0 2 1  PHQ- 9 Score - 3 -   Last fall risk screening Fall Risk  03/05/2020  Falls in the past year? 0  Number falls in past yr: 0  Injury with Fall? 0  Risk for fall due to : No Fall Risks  Follow up Falls evaluation completed   Last Audit-C alcohol use screening Alcohol Use Disorder Test (AUDIT) 03/04/2019  1. How often do you have a drink containing alcohol? 0  2. How many drinks containing alcohol do you have on a typical day when you are drinking? 0  3. How often do you have six or more drinks on one occasion? 0  AUDIT-C Score 0   A score of 3 or more in women, and 4 or more in men indicates increased risk for alcohol abuse, EXCEPT if all of the points are from question 1   No results found for any visits on 03/05/20.  Assessment & Plan    Routine Health Maintenance  and Physical Exam  Exercise Activities and Dietary recommendations Goals   None     Immunization History  Administered Date(s) Administered  . Influenza,inj,Quad PF,6+ Mos 03/25/2019  . PFIZER SARS-COV-2 Vaccination 09/10/2019, 10/04/2019  . Tdap 03/04/2019    Health Maintenance  Topic Date Due  . Hepatitis C Screening  Never done  . INFLUENZA VACCINE  01/22/2020  . TETANUS/TDAP  03/03/2029  . COVID-19 Vaccine  Completed  . HIV Screening  Completed    Discussed health benefits of physical activity, and encouraged him to engage in regular exercise appropriate for his age and condition.  1. Annual physical exam Normal physical exam today. Will check labs as below and f/u pending lab results. If labs are stable and WNL he will not need to have these rechecked for one year at his next annual physical exam. He is to call the office in the meantime if he has any acute issue, questions or concerns. - CBC with Differential/Platelet - Comprehensive metabolic panel - Lipid panel  2. Need for influenza vaccination Flu vaccine given today without complication. Patient sat upright for 15 minutes to check for adverse reaction before being released. - Flu Vaccine QUAD 36+ mos IM  3. BMI 29.0-29.9,adult Counseled patient on healthy lifestyle modifications including dieting and exercise. Has been doing well and has lost 63 pounds on his own.  Will check labs as below and f/u pending results. - CBC with Differential/Platelet - Comprehensive metabolic panel - Lipid panel - HgB A1c  4. Thyroid disorder screen Will check labs as below and f/u pending results. - TSH  5. Encounter for hepatitis C screening test for low risk patient Will check labs as below and f/u pending results. - Hepatitis C Antibody  6. Prostate cancer screening Will check labs as below and f/u pending results. - PSA  7. Colon cancer screening Referral placed. No family history. - Ambulatory referral to  Gastroenterology  8. Chronic left-sided thoracic back pain Mild. Seems MSK. Advised to try heat, massage, ibuprofen prn. May seek chiropractor care. Call if worsening.    No follow-ups on file.     Reynolds Bowl, PA-C, have reviewed all documentation for this visit. The documentation on 03/13/20 for the exam, diagnosis, procedures, and orders are all accurate and complete.   Rubye Beach  [facility] [phone] (phone) [phone] (fax)  [place]

## 2020-03-06 LAB — CBC WITH DIFFERENTIAL/PLATELET
Basophils Absolute: 0 10*3/uL (ref 0.0–0.2)
Basos: 1 %
EOS (ABSOLUTE): 0 10*3/uL (ref 0.0–0.4)
Eos: 1 %
Hematocrit: 48.3 % (ref 37.5–51.0)
Hemoglobin: 16.1 g/dL (ref 13.0–17.7)
Immature Grans (Abs): 0 10*3/uL (ref 0.0–0.1)
Immature Granulocytes: 0 %
Lymphocytes Absolute: 1.3 10*3/uL (ref 0.7–3.1)
Lymphs: 32 %
MCH: 29.3 pg (ref 26.6–33.0)
MCHC: 33.3 g/dL (ref 31.5–35.7)
MCV: 88 fL (ref 79–97)
Monocytes Absolute: 0.2 10*3/uL (ref 0.1–0.9)
Monocytes: 5 %
Neutrophils Absolute: 2.4 10*3/uL (ref 1.4–7.0)
Neutrophils: 61 %
Platelets: 199 10*3/uL (ref 150–450)
RBC: 5.49 x10E6/uL (ref 4.14–5.80)
RDW: 12.8 % (ref 11.6–15.4)
WBC: 3.9 10*3/uL (ref 3.4–10.8)

## 2020-03-06 LAB — COMPREHENSIVE METABOLIC PANEL
ALT: 29 IU/L (ref 0–44)
AST: 11 IU/L (ref 0–40)
Albumin/Globulin Ratio: 2.6 — ABNORMAL HIGH (ref 1.2–2.2)
Albumin: 4.6 g/dL (ref 4.0–5.0)
Alkaline Phosphatase: 48 IU/L (ref 44–121)
BUN/Creatinine Ratio: 16 (ref 9–20)
BUN: 18 mg/dL (ref 6–24)
Bilirubin Total: 0.9 mg/dL (ref 0.0–1.2)
CO2: 24 mmol/L (ref 20–29)
Calcium: 9.5 mg/dL (ref 8.7–10.2)
Chloride: 104 mmol/L (ref 96–106)
Creatinine, Ser: 1.11 mg/dL (ref 0.76–1.27)
GFR calc Af Amer: 92 mL/min/{1.73_m2} (ref >59)
GFR calc non Af Amer: 80 mL/min/{1.73_m2} (ref >59)
Globulin, Total: 1.8 g/dL (ref 1.5–4.5)
Glucose: 102 mg/dL — ABNORMAL HIGH (ref 65–99)
Potassium: 4.3 mmol/L (ref 3.5–5.2)
Sodium: 141 mmol/L (ref 134–144)
Total Protein: 6.4 g/dL (ref 6.0–8.5)

## 2020-03-06 LAB — HEMOGLOBIN A1C
Est. average glucose Bld gHb Est-mCnc: 100 mg/dL
Hgb A1c MFr Bld: 5.1 % (ref 4.8–5.6)

## 2020-03-06 LAB — HEPATITIS C ANTIBODY: Hep C Virus Ab: <0.1 s/co ratio (ref 0.0–0.9)

## 2020-03-06 LAB — TSH: TSH: 1.3 u[IU]/mL (ref 0.450–4.500)

## 2020-03-06 LAB — LIPID PANEL
Chol/HDL Ratio: 3 ratio (ref 0.0–5.0)
Cholesterol, Total: 120 mg/dL (ref 100–199)
HDL: 40 mg/dL (ref >39)
LDL Chol Calc (NIH): 65 mg/dL (ref 0–99)
Triglycerides: 70 mg/dL (ref 0–149)
VLDL Cholesterol Cal: 15 mg/dL (ref 5–40)

## 2020-03-06 LAB — PSA: Prostate Specific Ag, Serum: 0.7 ng/mL (ref 0.0–4.0)

## 2020-03-07 ENCOUNTER — Telehealth: Payer: Self-pay

## 2020-03-07 NOTE — Telephone Encounter (Signed)
Patient advised as directed below. 

## 2020-03-07 NOTE — Telephone Encounter (Signed)
-----   Message from Mar Daring, [place] sent at 03/06/2020  3:46 PM EDT ----- Blood count is normal. Kidney and liver function are normal. Sodium, potassium, and calcium are normal. Cholesterol is normal and improved compared to last year. Thyroid is normal. A1c/sugar is normal. PSA is normal. Hepatitis C screening is negative.

## 2020-03-13 DIAGNOSIS — Z6837 Body mass index (BMI) 37.0-37.9, adult: Secondary | ICD-10-CM | POA: Insufficient documentation

## 2020-03-13 DIAGNOSIS — E66812 Obesity, class 2: Secondary | ICD-10-CM | POA: Insufficient documentation

## 2020-03-28 ENCOUNTER — Telehealth (INDEPENDENT_AMBULATORY_CARE_PROVIDER_SITE_OTHER): Payer: Self-pay | Admitting: Gastroenterology

## 2020-03-28 ENCOUNTER — Other Ambulatory Visit: Payer: Self-pay

## 2020-03-28 DIAGNOSIS — Z1211 Encounter for screening for malignant neoplasm of colon: Secondary | ICD-10-CM

## 2020-03-28 MED ORDER — PEG 3350-KCL-NA BICARB-NACL 420 G PO SOLR: 4000.0000 mL | Freq: Once | ORAL | 0 refills | Status: AC

## 2020-03-28 NOTE — Progress Notes (Signed)
Gastroenterology Pre-Procedure Review  Request Date: Thursday 04/12/20 Requesting Physician: Dr. Marius Ditch  PATIENT REVIEW QUESTIONS: The patient responded to the following health history questions as indicated:    1. Are you having any GI issues? no 2. Do you have a personal history of Polyps? no 3. Do you have a family history of Colon Cancer or Polyps? unsure dad had to have colon removed doesn't know if it was colon cancer or polyps 4. Diabetes Mellitus? no 5. Joint replacements in the past 12 months?no 6. Major health problems in the past 3 months?no 7. Any artificial heart valves, MVP, or defibrillator?no    MEDICATIONS & ALLERGIES:    Patient reports the following regarding taking any anticoagulation/antiplatelet therapy:   Plavix, Coumadin, Eliquis, Xarelto, Lovenox, Pradaxa, Brilinta, or Effient? no Aspirin? no  Patient confirms/reports the following medications:  Current Outpatient Medications  Medication Sig Dispense Refill   Cholecalciferol (VITAMIN D PO) Take by mouth.     polyethylene glycol-electrolytes (NULYTELY) 420 g solution Take 4,000 mLs by mouth once for 1 dose. 4000 mL 0   No current facility-administered medications for this visit.    Patient confirms/reports the following allergies:  Allergies  Allergen Reactions   Codeine Nausea And Vomiting and Nausea Only   Fluticasone-Salmeterol     Other reaction(s): Headache   Promethazine     Other reaction(s): Dizziness    Orders Placed This Encounter  Procedures   Procedural/ Surgical Case Request: COLONOSCOPY WITH PROPOFOL    Standing Status:   Standing    Number of Occurrences:   1    Order Specific Question:   Pre-op diagnosis    Answer:   Screening colonoscopy    Order Specific Question:   CPT Code    Answer:   97416    AUTHORIZATION INFORMATION Primary Insurance: 1D#: Group #:  Secondary Insurance: 1D#: Group #:  SCHEDULE INFORMATION: Date: Thursday 04/12/20 Time: Location:[facility]

## 2020-03-30 ENCOUNTER — Encounter: Payer: Self-pay | Admitting: Physician Assistant

## 2020-04-03 ENCOUNTER — Encounter: Payer: Self-pay | Admitting: Gastroenterology

## 2020-04-06 ENCOUNTER — Telehealth: Payer: Self-pay

## 2020-04-06 NOTE — Telephone Encounter (Signed)
Patient wanted to know what code we were using. We were using a screening colonoscopy. Informed patient and she verbalized understanding

## 2020-04-06 NOTE — Telephone Encounter (Signed)
-----   Message from Storm Frisk, [place] sent at 04/05/2020  4:50 PM EDT ----- Regarding: Patient appt. Derrick James,  Patient called and left voicemail saying his insurance will not cover unless it is a preventative procedure. Can you give him a call [phone].     -Jovon

## 2020-04-09 NOTE — Discharge Instructions (Signed)
General Anesthesia, Adult, Care After This sheet gives you information about how to care for yourself after your procedure. Your health care provider may also give you more specific instructions. If you have problems or questions, contact your health care provider. What can I expect after the procedure? After the procedure, the following side effects are common:  Pain or discomfort at the IV site.  Nausea.  Vomiting.  Sore throat.  Trouble concentrating.  Feeling cold or chills.  Weak or tired.  Sleepiness and fatigue.  Soreness and body aches. These side effects can affect parts of the body that were not involved in surgery. Follow these instructions at home:  For at least 24 hours after the procedure:  Have a responsible adult stay with you. It is important to have someone help care for you until you are awake and alert.  Rest as needed.  Do not: ? Participate in activities in which you could fall or become injured. ? Drive. ? Use heavy machinery. ? Drink alcohol. ? Take sleeping pills or medicines that cause drowsiness. ? Make important decisions or sign legal documents. ? Take care of children on your own. Eating and drinking  Follow any instructions from your health care provider about eating or drinking restrictions.  When you feel hungry, start by eating small amounts of foods that are soft and easy to digest (bland), such as toast. Gradually return to your regular diet.  Drink enough fluid to keep your urine pale yellow.  If you vomit, rehydrate by drinking water, juice, or clear broth. General instructions  If you have sleep apnea, surgery and certain medicines can increase your risk for breathing problems. Follow instructions from your health care provider about wearing your sleep device: ? Anytime you are sleeping, including during daytime naps. ? While taking prescription pain medicines, sleeping medicines, or medicines that make you drowsy.  Return to  your normal activities as told by your health care provider. Ask your health care provider what activities are safe for you.  Take over-the-counter and prescription medicines only as told by your health care provider.  If you smoke, do not smoke without supervision.  Keep all follow-up visits as told by your health care provider. This is important. Contact a health care provider if:  You have nausea or vomiting that does not get better with medicine.  You cannot eat or drink without vomiting.  You have pain that does not get better with medicine.  You are unable to pass urine.  You develop a skin rash.  You have a fever.  You have redness around your IV site that gets worse. Get help right away if:  You have difficulty breathing.  You have chest pain.  You have blood in your urine or stool, or you vomit blood. Summary  After the procedure, it is common to have a sore throat or nausea. It is also common to feel tired.  Have a responsible adult stay with you for the first 24 hours after general anesthesia. It is important to have someone help care for you until you are awake and alert.  When you feel hungry, start by eating small amounts of foods that are soft and easy to digest (bland), such as toast. Gradually return to your regular diet.  Drink enough fluid to keep your urine pale yellow.  Return to your normal activities as told by your health care provider. Ask your health care provider what activities are safe for you. This information is not   intended to replace advice given to you by your health care provider. Make sure you discuss any questions you have with your health care provider. Document Revised: 06/12/2017 Document Reviewed: 01/23/2017 Elsevier Patient Education  2020 Elsevier Inc.  

## 2020-04-10 ENCOUNTER — Other Ambulatory Visit
Admission: RE | Admit: 2020-04-10 | Discharge: 2020-04-10 | Disposition: A | Discharge status: Home / Self Care | Payer: BC Managed Care – PPO | Source: Ambulatory Visit | Attending: Gastroenterology | Admitting: Gastroenterology

## 2020-04-10 ENCOUNTER — Other Ambulatory Visit: Payer: Self-pay

## 2020-04-10 DIAGNOSIS — Z20822 Contact with and (suspected) exposure to covid-19: Secondary | ICD-10-CM | POA: Diagnosis not present

## 2020-04-10 DIAGNOSIS — Z01812 Encounter for preprocedural laboratory examination: Secondary | ICD-10-CM | POA: Diagnosis not present

## 2020-04-11 LAB — SARS CORONAVIRUS 2 (TAT 6-24 HRS): SARS Coronavirus 2: NEGATIVE

## 2020-04-12 ENCOUNTER — Encounter
Admission: RE | Disposition: A | Discharge status: Home / Self Care | Payer: Self-pay | Source: Home / Self Care | Attending: Gastroenterology

## 2020-04-12 ENCOUNTER — Encounter: Payer: Self-pay | Admitting: Gastroenterology

## 2020-04-12 ENCOUNTER — Ambulatory Visit: Payer: BC Managed Care – PPO | Admitting: Anesthesiology

## 2020-04-12 ENCOUNTER — Other Ambulatory Visit: Payer: Self-pay

## 2020-04-12 ENCOUNTER — Ambulatory Visit
Admission: RE | Admit: 2020-04-12 | Discharge: 2020-04-12 | Disposition: A | Discharge status: Home / Self Care | Payer: BC Managed Care – PPO | Attending: Gastroenterology | Admitting: Gastroenterology

## 2020-04-12 DIAGNOSIS — Z800 Family history of malignant neoplasm of digestive organs: Secondary | ICD-10-CM | POA: Diagnosis not present

## 2020-04-12 DIAGNOSIS — Z888 Allergy status to other drugs, medicaments and biological substances status: Secondary | ICD-10-CM | POA: Diagnosis not present

## 2020-04-12 DIAGNOSIS — D126 Benign neoplasm of colon, unspecified: Secondary | ICD-10-CM | POA: Diagnosis not present

## 2020-04-12 DIAGNOSIS — Z885 Allergy status to narcotic agent status: Secondary | ICD-10-CM | POA: Diagnosis not present

## 2020-04-12 DIAGNOSIS — K635 Polyp of colon: Secondary | ICD-10-CM

## 2020-04-12 DIAGNOSIS — D123 Benign neoplasm of transverse colon: Secondary | ICD-10-CM | POA: Insufficient documentation

## 2020-04-12 DIAGNOSIS — Z1211 Encounter for screening for malignant neoplasm of colon: Secondary | ICD-10-CM | POA: Diagnosis not present

## 2020-04-12 HISTORY — PX: POLYPECTOMY: SHX5525

## 2020-04-12 HISTORY — PX: COLONOSCOPY WITH PROPOFOL: SHX5780

## 2020-04-12 SURGERY — COLONOSCOPY WITH PROPOFOL
Anesthesia: General | Site: Rectum

## 2020-04-12 MED ORDER — LACTATED RINGERS IV SOLN: INTRAVENOUS | Status: DC

## 2020-04-12 MED ORDER — STERILE WATER FOR IRRIGATION IR SOLN: Status: DC | PRN

## 2020-04-12 MED ORDER — PROPOFOL 10 MG/ML IV BOLUS
INTRAVENOUS | Status: DC | PRN
  Administered 2020-04-12: 50 mg via INTRAVENOUS
  Administered 2020-04-12: 60 mg via INTRAVENOUS
  Administered 2020-04-12 (×3): 40 mg via INTRAVENOUS
  Administered 2020-04-12: 50 mg via INTRAVENOUS

## 2020-04-12 MED ORDER — LIDOCAINE HCL (CARDIAC) PF 100 MG/5ML IV SOSY
PREFILLED_SYRINGE | INTRAVENOUS | Status: DC | PRN
  Administered 2020-04-12: 40 mg via INTRAVENOUS

## 2020-04-12 SURGICAL SUPPLY — 7 items
FORCEPS BIOP RAD 4 LRG CAP 4 (CUTTING FORCEPS) ×2 IMPLANT
GOWN CVR UNV OPN BCK APRN NK (MISCELLANEOUS) ×2 IMPLANT
GOWN ISOL THUMB LOOP REG UNIV (MISCELLANEOUS) ×4
KIT PRC NS LF DISP ENDO (KITS) ×1 IMPLANT
KIT PROCEDURE OLYMPUS (KITS) ×2
MANIFOLD NEPTUNE II (INSTRUMENTS) ×2 IMPLANT
WATER STERILE IRR 250ML POUR (IV SOLUTION) ×2 IMPLANT

## 2020-04-12 NOTE — Op Note (Signed)
[facility] Gastroenterology Patient Name: Derrick James Procedure Date: 04/12/2020 9:11 AM MRN: [id number] Account #: [id number] Date of Birth: 02/06/1976 Admit Type: Outpatient Age: 44 Room: [facility] OR ROOM 01 Gender: Male Note Status: Finalized Procedure:             Colonoscopy Indications:           Screening in patient at increased risk: Colorectal                         cancer in father 77 or older, This is the patient's                         first colonoscopy Providers:             Lin Landsman MD, MD Referring MD:          Mar Daring (Referring MD) Medicines:             General Anesthesia Complications:         No immediate complications. Estimated blood loss: None. Procedure:             Pre-Anesthesia Assessment:                        - Prior to the procedure, a History and Physical was                         performed, and patient medications and allergies were                         reviewed. The patient is competent. The risks and                         benefits of the procedure and the sedation options and                         risks were discussed with the patient. All questions                         were answered and informed consent was obtained.                         Patient identification and proposed procedure were                         verified by the physician, the nurse, the                         anesthesiologist, the anesthetist and the technician                         in the pre-procedure area in the procedure room in the                         endoscopy suite. Mental Status Examination: alert and                         oriented. Airway Examination: normal oropharyngeal  airway and neck mobility. Respiratory Examination:                         clear to auscultation. CV Examination: normal.                         Prophylactic Antibiotics: The patient does not require                          prophylactic antibiotics. Prior Anticoagulants: The                         patient has taken no previous anticoagulant or                         antiplatelet agents. ASA Grade Assessment: I - A                         normal, healthy patient. After reviewing the risks and                         benefits, the patient was deemed in satisfactory                         condition to undergo the procedure. The anesthesia                         plan was to use general anesthesia. Immediately prior                         to administration of medications, the patient was                         re-assessed for adequacy to receive sedatives. The                         heart rate, respiratory rate, oxygen saturations,                         blood pressure, adequacy of pulmonary ventilation, and                         response to care were monitored throughout the                         procedure. The physical status of the patient was                         re-assessed after the procedure.                        After obtaining informed consent, the colonoscope was                         passed under direct vision. Throughout the procedure,                         the patient's blood pressure, pulse, and oxygen  saturations were monitored continuously. The was                         introduced through the anus and advanced to the the                         cecum, identified by appendiceal orifice and ileocecal                         valve. The colonoscopy was technically difficult and                         complex due to inadequate bowel prep and significant                         looping. Successful completion of the procedure was                         aided by applying abdominal pressure. The patient                         tolerated the procedure well. The quality of the bowel                         preparation was evaluated using the BBPS  [facility] Bowel                         Preparation Scale) with scores of: Right Colon = 2                         (minor amount of residual staining, small fragments of                         stool and/or opaque liquid, but mucosa seen well),                         Transverse Colon = 2 (minor amount of residual                         staining, small fragments of stool and/or opaque                         liquid, but mucosa seen well) and Left Colon = 2                         (minor amount of residual staining, small fragments of                         stool and/or opaque liquid, but mucosa seen well). The                         total BBPS score equals 6. Findings:      The perianal and digital rectal examinations were normal. Pertinent       negatives include normal sphincter tone and no palpable rectal lesions.      A diminutive polyp was found in the transverse colon. The polyp was  sessile. The polyp was removed with a cold biopsy forceps. Resection and       retrieval were complete.      Copious quantities of liquid stool was found in the entire colon,       precluding visualization. Lavage of the area was performed using 50 -       200 mL of sterile water, resulting in clearance with fair visualization.      The retroflexed view of the distal rectum and anal verge was normal and       showed no anal or rectal abnormalities. Impression:            - One diminutive polyp in the transverse colon,                         removed with a cold biopsy forceps. Resected and                         retrieved.                        - Stool in the entire examined colon.                        - The distal rectum and anal verge are normal on                         retroflexion view. Recommendation:        - Discharge patient to home (with escort).                        - Resume previous diet today.                        - Continue present medications.                        -  Await pathology results.                        - Repeat colonoscopy in 5 years for surveillance. Procedure Code(s):     --- Professional ---                        [phone], Colonoscopy, flexible; with biopsy, single or                         multiple Diagnosis Code(s):     --- Professional ---                        Z80.0, Family history of malignant neoplasm of                         digestive organs                        K63.5, Polyp of colon CPT copyright 2019 American Medical Association. All rights reserved. The codes documented in this report are preliminary and upon coder review may  be revised to meet current compliance requirements. Dr. Ulyess Mort Lin Landsman MD, MD 04/12/2020 9:54:05 AM This report has been signed electronically. Number  of Addenda: 0 Note Initiated On: 04/12/2020 9:11 AM Scope Withdrawal Time: 0 hours 14 minutes 59 seconds  Total Procedure Duration: 0 hours 24 minutes 45 seconds  Estimated Blood Loss:  Estimated blood loss: none.      [facility]

## 2020-04-12 NOTE — Transfer of Care (Signed)
Immediate Anesthesia Transfer of Care Note  Patient: Derrick James  Procedure(s) Performed: COLONOSCOPY WITH BIOPSY (N/A Rectum) POLYPECTOMY (N/A Rectum)  Patient Location: PACU  Anesthesia Type: General  Level of Consciousness: awake, alert  and patient cooperative  Airway and Oxygen Therapy: Patient Spontanous Breathing and Patient connected to supplemental oxygen  Post-op Assessment: Post-op Vital signs reviewed, Patient's Cardiovascular Status Stable, Respiratory Function Stable, Patent Airway and No signs of Nausea or vomiting  Post-op Vital Signs: Reviewed and stable  Complications: No complications documented.

## 2020-04-12 NOTE — Anesthesia Preprocedure Evaluation (Signed)
Anesthesia Evaluation  Patient identified by MRN, date of birth, ID band Patient awake    Reviewed: Allergy & Precautions, NPO status , Patient's Chart, lab work & pertinent test results, Unable to perform ROS - Chart review only  Airway Mallampati: II  TM Distance: >3 FB Neck ROM: Full    Dental no notable dental hx.    Pulmonary neg pulmonary ROS,    Pulmonary exam normal        Cardiovascular Exercise Tolerance: Good negative cardio ROS Normal cardiovascular exam     Neuro/Psych negative neurological ROS  negative psych ROS   GI/Hepatic negative GI ROS, Neg liver ROS,   Endo/Other  negative endocrine ROS  Renal/GU negative Renal ROS     Musculoskeletal negative musculoskeletal ROS (+)   Abdominal Normal abdominal exam  (+)   Peds  Hematology negative hematology ROS (+)   Anesthesia Other Findings   Reproductive/Obstetrics                             Anesthesia Physical Anesthesia Plan  ASA: I  Anesthesia Plan: General   Post-op Pain Management:    Induction: Intravenous  PONV Risk Score and Plan: 2 and Propofol infusion, TIVA and Treatment may vary due to age or medical condition  Airway Management Planned: Natural Airway  Additional Equipment:   Intra-op Plan:   Post-operative Plan:   Informed Consent: I have reviewed the patients History and Physical, chart, labs and discussed the procedure including the risks, benefits and alternatives for the proposed anesthesia with the patient or authorized representative who has indicated his/her understanding and acceptance.     Dental advisory given  Plan Discussed with: CRNA  Anesthesia Plan Comments:         Anesthesia Quick Evaluation

## 2020-04-12 NOTE — Anesthesia Postprocedure Evaluation (Signed)
Anesthesia Post Note  Patient: Derrick James  Procedure(s) Performed: COLONOSCOPY WITH BIOPSY (N/A Rectum) POLYPECTOMY (N/A Rectum)     Patient location during evaluation: PACU Anesthesia Type: General Level of consciousness: awake and alert Pain management: pain level controlled Vital Signs Assessment: post-procedure vital signs reviewed and stable Respiratory status: spontaneous breathing and nonlabored ventilation Cardiovascular status: blood pressure returned to baseline Postop Assessment: no apparent nausea or vomiting Anesthetic complications: no   No complications documented.  Christoph Copelan [employer]

## 2020-04-12 NOTE — H&P (Signed)
Derrick Darby, MD [address]  [place]  [place], [place] [address]  Main: [phone]  Fax: [phone] Pager: [phone]  Primary Care Physician:  Mar Daring, PA-C Primary Gastroenterologist:  Dr. Cephas James  Pre-Procedure History & Physical: HPI:  Derrick James is a 44 y.o. male is here for an colonoscopy.   Past Medical History:  Diagnosis Date  . Chronic kidney disease    stones    Past Surgical History:  Procedure Laterality Date  . CHOLECYSTECTOMY      Prior to Admission medications   Medication Sig Start Date End Date Taking? Authorizing Provider  Cholecalciferol (VITAMIN D PO) Take by mouth.   Yes [provider]    Allergies as of 03/28/2020 - Review Complete 03/28/2020  Allergen Reaction Noted  . Codeine Nausea And Vomiting and Nausea Only 11/12/2013  . Fluticasone-salmeterol  11/12/2013  . Promethazine  08/15/2015    History reviewed. No pertinent family history.  Social History   Socioeconomic History  . Marital status: Married    Spouse name: Not on file  . Number of children: Not on file  . Years of education: Not on file  . Highest education level: Not on file  Occupational History  . Not on file  Tobacco Use  . Smoking status: Never Smoker  . Smokeless tobacco: Never Used  Substance and Sexual Activity  . Alcohol use: No  . Drug use: No  . Sexual activity: Never  Other Topics Concern  . Not on file  Social History Narrative  . Not on file   Social Determinants of Health   Financial Resource Strain:   . Difficulty of Paying Living Expenses: Not on file  Food Insecurity:   . Worried About Charity fundraiser in the Last Year: Not on file  . Ran Out of Food in the Last Year: Not on file  Transportation Needs:   . Lack of Transportation (Medical): Not on file  . Lack of Transportation (Non-Medical): Not on file  Physical Activity:   . Days of Exercise per Week: Not on file  . Minutes of  Exercise per Session: Not on file  Stress:   . Feeling of Stress : Not on file  Social Connections:   . Frequency of Communication with Friends and Family: Not on file  . Frequency of Social Gatherings with Friends and Family: Not on file  . Attends Religious Services: Not on file  . Active Member of Clubs or Organizations: Not on file  . Attends Archivist Meetings: Not on file  . Marital Status: Not on file  Intimate Partner Violence:   . Fear of Current or Ex-Partner: Not on file  . Emotionally Abused: Not on file  . Physically Abused: Not on file  . Sexually Abused: Not on file    Review of Systems: See HPI, otherwise negative ROS  Physical Exam: BP (!) 136/91   Pulse 70   Temp 98.1 F (36.7 C) (Temporal)   Ht 6' (1.829 m)   Wt 95.3 kg   SpO2 100%   BMI 28.48 kg/m  General:   Alert,  pleasant and cooperative in NAD Head:  Normocephalic and atraumatic. Neck:  Supple; no masses or thyromegaly. Lungs:  Clear throughout to auscultation.    Heart:  Regular rate and rhythm. Abdomen:  Soft, nontender and nondistended. Normal bowel sounds, without guarding, and without rebound.   Neurologic:  Alert and  oriented x4;  grossly normal neurologically.  Impression/Plan: Derrick James is here for an colonoscopy to be performed for colon cancerscreening  Risks, benefits, limitations, and alternatives regarding  colonoscopy have been reviewed with the patient.  Questions have been answered.  All parties agreeable.   Sherri Sear, MD  04/12/2020, 9:17 AM

## 2020-04-12 NOTE — Anesthesia Procedure Notes (Signed)
Procedure Name: MAC Date/Time: 04/12/2020 9:20 AM Performed by: Silvana Newness, CRNA Pre-anesthesia Checklist: Patient identified, Emergency Drugs available, Suction available, Patient being monitored and Timeout performed Patient Re-evaluated:Patient Re-evaluated prior to induction Oxygen Delivery Method: Nasal cannula Placement Confirmation: positive ETCO2

## 2020-04-13 ENCOUNTER — Encounter: Payer: Self-pay | Admitting: Gastroenterology

## 2020-04-16 LAB — SURGICAL PATHOLOGY

## 2020-04-17 ENCOUNTER — Encounter: Payer: Self-pay | Admitting: Gastroenterology

## 2020-08-30 ENCOUNTER — Encounter: Payer: Self-pay | Admitting: Physician Assistant

## 2020-08-30 ENCOUNTER — Telehealth: Payer: BC Managed Care – PPO | Admitting: Physician Assistant

## 2020-08-30 VITALS — Temp 98.0°F

## 2020-08-30 DIAGNOSIS — J0140 Acute pansinusitis, unspecified: Secondary | ICD-10-CM

## 2020-08-30 DIAGNOSIS — R059 Cough, unspecified: Secondary | ICD-10-CM | POA: Diagnosis not present

## 2020-08-30 MED ORDER — AMOXICILLIN-POT CLAVULANATE 875-125 MG PO TABS: 1.0000 | ORAL_TABLET | Freq: Two times a day (BID) | ORAL | 0 refills | Status: DC

## 2020-08-30 NOTE — Progress Notes (Signed)
MyChart Video Visit    Virtual Visit via Video Note   This visit type was conducted due to national recommendations for restrictions regarding the COVID-19 Pandemic (e.g. social distancing) in an effort to limit this patient's exposure and mitigate transmission in our community. This patient is at least at moderate risk for complications without adequate follow up. This format is felt to be most appropriate for this patient at this time. Physical exam was limited by quality of the video and audio technology used for the visit.   Patient location: Home Provider location: [facility]  I discussed the limitations of evaluation and management by telemedicine and the availability of in person appointments. The patient expressed understanding and agreed to proceed.  Patient: Derrick James   DOB: 08/16/1975   45 y.o. Male  MRN: [id number] Visit Date: 08/30/2020  Today's healthcare provider: Mar Daring, PA-C   Chief Complaint  Patient presents with  . Sinus Problem   Subjective    Sinus Problem This is a new problem. The current episode started in the past 7 days. The problem has been gradually worsening since onset. There has been no fever. Associated symptoms include congestion, coughing, a hoarse voice, sinus pressure and a sore throat. Pertinent negatives include no chills, diaphoresis, ear pain, headaches, neck pain, shortness of breath, sneezing or swollen glands. Treatments tried: Advil Sinus Congestion & Pain. The treatment provided mild relief.      Patient Active Problem List   Diagnosis Date Noted  . History of Helicobacter pylori infection 09/03/2017  . History of renal stone 09/03/2017  . S/P cholecystectomy 09/03/2017   Past Medical History:  Diagnosis Date  . Chronic kidney disease    stones   Allergies  Allergen Reactions  . Codeine Nausea And Vomiting and Nausea Only  . Fluticasone-Salmeterol     Other reaction(s): Headache  . Promethazine     Other  reaction(s): Dizziness      Medications: Outpatient Medications Prior to Visit  Medication Sig  . Cholecalciferol (VITAMIN D PO) Take by mouth.   No facility-administered medications prior to visit.    Review of Systems  Constitutional: Negative for chills and diaphoresis.  HENT: Positive for congestion, hoarse voice, postnasal drip, rhinorrhea, sinus pressure and sore throat. Negative for ear pain and sneezing.   Respiratory: Positive for cough. Negative for shortness of breath.   Musculoskeletal: Negative for neck pain.  Neurological: Negative for headaches.    Last CBC Lab Results  Component Value Date   WBC 3.9 03/05/2020   HGB 16.1 03/05/2020   HCT 48.3 03/05/2020   MCV 88 03/05/2020   MCH 29.3 03/05/2020   RDW 12.8 03/05/2020   PLT 199 93/79/0240   Last metabolic panel Lab Results  Component Value Date   GLUCOSE 102 (H) 03/05/2020   NA 141 03/05/2020   K 4.3 03/05/2020   CL 104 03/05/2020   CO2 24 03/05/2020   BUN 18 03/05/2020   CREATININE 1.11 03/05/2020   GFRNONAA 80 03/05/2020   GFRAA 92 03/05/2020   CALCIUM 9.5 03/05/2020   PROT 6.4 03/05/2020   ALBUMIN 4.6 03/05/2020   LABGLOB 1.8 03/05/2020   AGRATIO 2.6 (H) 03/05/2020   BILITOT 0.9 03/05/2020   ALKPHOS 48 03/05/2020   AST 11 03/05/2020   ALT 29 03/05/2020      Objective    Temp 98 F (36.7 C) (Oral)  BP Readings from Last 3 Encounters:  04/12/20 (!) 113/91  03/05/20 126/81  03/25/19 125/88  Wt Readings from Last 3 Encounters:  04/12/20 210 lb (95.3 kg)  03/05/20 215 lb 3.2 oz (97.6 kg)  03/25/19 268 lb (121.6 kg)      Physical Exam Vitals reviewed.  Constitutional:      General: He is not in acute distress.    Appearance: He is well-developed.  HENT:     Head: Normocephalic and atraumatic.     Nose:     Comments: Patient reports sinus tenderness over maxillary and ethmoid sinuses Eyes:     Conjunctiva/sclera: Conjunctivae normal.  Pulmonary:     Effort: Pulmonary effort  is normal. No respiratory distress.  Musculoskeletal:     Cervical back: Normal range of motion and neck supple.  Psychiatric:        Behavior: Behavior normal.        Thought Content: Thought content normal.        Judgment: Judgment normal.        Assessment & Plan     1. Acute non-recurrent pansinusitis Worsening symptoms that have not responded to OTC medications. Will give augmentin as below. Continue allergy medications. Stay well hydrated and get plenty of rest. Call if no symptom improvement or if symptoms worsen. - amoxicillin-clavulanate (AUGMENTIN) 875-125 MG tablet; Take 1 tablet by mouth 2 (two) times daily.  Dispense: 20 tablet; Refill: 0  2. Cough Delsym can be used. Call if worsening.    No follow-ups on file.     I discussed the assessment and treatment plan with the patient. The patient was provided an opportunity to ask questions and all were answered. The patient agreed with the plan and demonstrated an understanding of the instructions.   The patient was advised to call back or seek an in-person evaluation if the symptoms worsen or if the condition fails to improve as anticipated.  I provided 15 minutes of face-to-face time during this encounter via MyChart Video enabled encounter.Reynolds Bowl, PA-C, have reviewed all documentation for this visit. The documentation on 09/09/20 for the exam, diagnosis, procedures, and orders are all accurate and complete.  [place] [facility] [phone] (phone) [phone] (fax)  [place]

## 2020-09-09 ENCOUNTER — Encounter: Payer: Self-pay | Admitting: Physician Assistant

## 2021-03-08 ENCOUNTER — Encounter: Payer: Self-pay | Admitting: Physician Assistant

## 2021-03-08 ENCOUNTER — Encounter: Payer: Self-pay | Admitting: Family Medicine

## 2021-03-19 ENCOUNTER — Other Ambulatory Visit: Payer: Self-pay

## 2021-03-19 ENCOUNTER — Encounter: Payer: Self-pay | Admitting: Family Medicine

## 2021-03-19 ENCOUNTER — Ambulatory Visit (INDEPENDENT_AMBULATORY_CARE_PROVIDER_SITE_OTHER): Payer: BC Managed Care – PPO | Admitting: Family Medicine

## 2021-03-19 VITALS — BP 135/91 | HR 69 | Temp 98.1°F | Resp 16 | Ht 72.0 in | Wt 237.0 lb

## 2021-03-19 DIAGNOSIS — G8929 Other chronic pain: Secondary | ICD-10-CM

## 2021-03-19 DIAGNOSIS — E559 Vitamin D deficiency, unspecified: Secondary | ICD-10-CM

## 2021-03-19 DIAGNOSIS — Z0000 Encounter for general adult medical examination without abnormal findings: Secondary | ICD-10-CM

## 2021-03-19 DIAGNOSIS — I10 Essential (primary) hypertension: Secondary | ICD-10-CM

## 2021-03-19 DIAGNOSIS — D1722 Benign lipomatous neoplasm of skin and subcutaneous tissue of left arm: Secondary | ICD-10-CM

## 2021-03-19 DIAGNOSIS — Z23 Encounter for immunization: Secondary | ICD-10-CM

## 2021-03-19 DIAGNOSIS — D171 Benign lipomatous neoplasm of skin and subcutaneous tissue of trunk: Secondary | ICD-10-CM

## 2021-03-19 DIAGNOSIS — E6609 Other obesity due to excess calories: Secondary | ICD-10-CM | POA: Insufficient documentation

## 2021-03-19 DIAGNOSIS — M546 Pain in thoracic spine: Secondary | ICD-10-CM

## 2021-03-19 NOTE — Assessment & Plan Note (Signed)

## 2021-03-19 NOTE — Assessment & Plan Note (Signed)
Under right breast Non tender

## 2021-03-19 NOTE — Assessment & Plan Note (Signed)
Small Left upper arm

## 2021-03-19 NOTE — Assessment & Plan Note (Signed)
Weight gain, BMI 32 BP elevated Complaints of back pain

## 2021-03-19 NOTE — Progress Notes (Signed)
Complete physical exam   Patient: Derrick James   DOB: 05-Jul-1975   45 y.o. Male  MRN: [id number] Visit Date: 03/19/2021  Today's healthcare provider: Gwyneth Sprout, FNP   Chief Complaint  Patient presents with   Annual Exam   Subjective    Derrick James is a 45 y.o. male who presents today for a complete physical exam.  He reports consuming a general diet. Gym/ health club routine includes cardio and weight lifting. He generally feels fairly well. He reports sleeping fairly well. He does have additional problems to discuss today (back pain).  HPI    Past Medical History:  Diagnosis Date   Chronic kidney disease    stones   Past Surgical History:  Procedure Laterality Date   CHOLECYSTECTOMY     COLONOSCOPY WITH PROPOFOL N/A 04/12/2020   Procedure: COLONOSCOPY WITH BIOPSY;  Surgeon: Lin Landsman, MD;  Location: [facility];  Service: Endoscopy;  Laterality: N/A;  PRIORITY 4   POLYPECTOMY N/A 04/12/2020   Procedure: POLYPECTOMY;  Surgeon: Lin Landsman, MD;  Location: [facility];  Service: Endoscopy;  Laterality: N/A;   Social History   Socioeconomic History   Marital status: Married    Spouse name: Not on file   Number of children: Not on file   Years of education: Not on file   Highest education level: Not on file  Occupational History   Not on file  Tobacco Use   Smoking status: Never   Smokeless tobacco: Never  Substance and Sexual Activity   Alcohol use: No   Drug use: No   Sexual activity: Never  Other Topics Concern   Not on file  Social History Narrative   Not on file   Social Determinants of Health   Financial Resource Strain: Not on file  Food Insecurity: Not on file  Transportation Needs: Not on file  Physical Activity: Not on file  Stress: Not on file  Social Connections: Not on file  Intimate Partner Violence: Not on file   No family status information on file.   History reviewed. No pertinent family  history. Allergies  Allergen Reactions   Codeine Nausea And Vomiting and Nausea Only   Fluticasone-Salmeterol     Other reaction(s): Headache   Promethazine     Other reaction(s): Dizziness    Patient Care Team: Gwyneth Sprout, FNP as PCP - General (Family Medicine)   Medications: Outpatient Medications Prior to Visit  Medication Sig   Cholecalciferol (VITAMIN D PO) Take by mouth.   [DISCONTINUED] amoxicillin-clavulanate (AUGMENTIN) 875-125 MG tablet Take 1 tablet by mouth 2 (two) times daily.   No facility-administered medications prior to visit.    Review of Systems  Constitutional:  Negative for appetite change, chills, fatigue and fever.  HENT:  Negative for congestion, ear pain, hearing loss, nosebleeds and trouble swallowing.   Eyes:  Negative for pain and visual disturbance.  Respiratory:  Negative for cough, chest tightness and shortness of breath.   Cardiovascular:  Negative for chest pain, palpitations and leg swelling.  Gastrointestinal:  Negative for abdominal pain, blood in stool, constipation, diarrhea, nausea and vomiting.  Endocrine: Negative for polydipsia, polyphagia and polyuria.  Genitourinary:  Negative for dysuria and flank pain.  Musculoskeletal:  Positive for back pain (started 1 year ago; intermittent pain). Negative for arthralgias, joint swelling, myalgias and neck stiffness.  Skin:  Negative for color change, rash and wound.  Neurological:  Negative for dizziness, tremors, seizures,  speech difficulty, weakness, light-headedness and headaches.  Psychiatric/Behavioral:  Negative for behavioral problems, confusion, decreased concentration, dysphoric mood and sleep disturbance. The patient is not nervous/anxious.   All other systems reviewed and are negative.    Objective    BP (!) 135/91 (BP Location: Left Arm, Patient Position: Sitting, Cuff Size: Large)   Pulse 69   Temp 98.1 F (36.7 C) (Oral)   Resp 16   Ht 6' (1.829 m)   Wt 237 lb (107.5 kg)    BMI 32.14 kg/m    Physical Exam Vitals and nursing note reviewed.  Constitutional:      General: He is awake. He is not in acute distress.    Appearance: Normal appearance. He is well-developed and well-groomed. He is obese. He is not ill-appearing, toxic-appearing or diaphoretic.  HENT:     Head: Normocephalic and atraumatic.     Jaw: There is normal jaw occlusion. No trismus, tenderness, swelling or pain on movement.     Salivary Glands: Right salivary gland is not diffusely enlarged or tender. Left salivary gland is not diffusely enlarged or tender.     Right Ear: Hearing, tympanic membrane, ear canal and external ear normal. There is no impacted cerumen.     Left Ear: Hearing, tympanic membrane, ear canal and external ear normal. There is no impacted cerumen.     Nose: Nose normal. No congestion or rhinorrhea.     Right Turbinates: Not enlarged, swollen or pale.     Left Turbinates: Not enlarged, swollen or pale.     Right Sinus: No maxillary sinus tenderness or frontal sinus tenderness.     Left Sinus: No maxillary sinus tenderness or frontal sinus tenderness.     Mouth/Throat:     Lips: Pink.     Mouth: Mucous membranes are moist. No injury, lacerations, oral lesions or angioedema.     Pharynx: Oropharynx is clear. Uvula midline. No pharyngeal swelling, oropharyngeal exudate or posterior oropharyngeal erythema.     Tonsils: No tonsillar exudate or tonsillar abscesses.  Eyes:     General: Lids are normal. Vision grossly intact. Gaze aligned appropriately.        Right eye: No discharge.        Left eye: No discharge.     Extraocular Movements: Extraocular movements intact.     Conjunctiva/sclera: Conjunctivae normal.     Pupils: Pupils are equal, round, and reactive to light.  Neck:     Thyroid: No thyroid mass, thyromegaly or thyroid tenderness.     Vascular: No carotid bruit.     Trachea: Trachea normal. No tracheal tenderness.  Cardiovascular:     Rate and Rhythm:  Normal rate and regular rhythm.     Pulses: Normal pulses.          Carotid pulses are 2+ on the right side and 2+ on the left side.      Radial pulses are 2+ on the right side and 2+ on the left side.       Femoral pulses are 2+ on the right side and 2+ on the left side.      Popliteal pulses are 2+ on the right side and 2+ on the left side.       Dorsalis pedis pulses are 2+ on the right side and 2+ on the left side.       Posterior tibial pulses are 2+ on the right side and 2+ on the left side.     Heart sounds: Normal heart  sounds, S1 normal and S2 normal. No murmur heard.   No friction rub. No gallop.  Pulmonary:     Effort: Pulmonary effort is normal. No respiratory distress.     Breath sounds: Normal breath sounds and air entry. No stridor. No wheezing, rhonchi or rales.  Chest:     Chest wall: No tenderness.  Abdominal:     General: Abdomen is flat. Bowel sounds are normal. There is no distension.     Palpations: Abdomen is soft. There is no mass.     Tenderness: There is no abdominal tenderness. There is no guarding or rebound.     Hernia: No hernia is present.  Genitourinary:    Comments: Exam deferred; denies complaints Musculoskeletal:        General: No swelling, tenderness, deformity or signs of injury. Normal range of motion.     Cervical back: Normal range of motion and neck supple. No rigidity or tenderness.     Right lower leg: No edema.     Left lower leg: No edema.  Lymphadenopathy:     Cervical: No cervical adenopathy.     Right cervical: No superficial, deep or posterior cervical adenopathy.    Left cervical: No superficial, deep or posterior cervical adenopathy.  Skin:    General: Skin is warm and dry.     Capillary Refill: Capillary refill takes less than 2 seconds.     Coloration: Skin is not jaundiced or pale.     Findings: No bruising, erythema, lesion or rash.  Neurological:     General: No focal deficit present.     Mental Status: He is alert and  oriented to person, place, and time. Mental status is at baseline.     GCS: GCS eye subscore is 4. GCS verbal subscore is 5. GCS motor subscore is 6.     Cranial Nerves: Cranial nerves are intact.     Sensory: Sensation is intact. No sensory deficit.     Motor: Motor function is intact. No weakness.     Coordination: Coordination is intact.     Gait: Gait is intact.  Psychiatric:        Attention and Perception: Attention and perception normal.        Mood and Affect: Mood and affect normal.        Speech: Speech normal.        Behavior: Behavior normal. Behavior is cooperative.        Thought Content: Thought content normal.        Cognition and Memory: Cognition normal.        Judgment: Judgment normal.     Last depression screening scores PHQ 2/9 Scores 03/19/2021 03/05/2020 03/04/2019  PHQ - 2 Score 2 0 2  PHQ- 9 Score 2 - 3   Last fall risk screening Fall Risk  03/19/2021  Falls in the past year? 0  Number falls in past yr: 0  Injury with Fall? 0  Risk for fall due to : No Fall Risks  Follow up Falls evaluation completed   Last Audit-C alcohol use screening Alcohol Use Disorder Test (AUDIT) 03/19/2021  1. How often do you have a drink containing alcohol? 0  2. How many drinks containing alcohol do you have on a typical day when you are drinking? 0  3. How often do you have six or more drinks on one occasion? 0  AUDIT-C Score 0   A score of 3 or more in women, and 4 or more  in men indicates increased risk for alcohol abuse, EXCEPT if all of the points are from question 1   No results found for any visits on 03/19/21.  Assessment & Plan    Routine Health Maintenance and Physical Exam  Exercise Activities and Dietary recommendations  Goals   None     Immunization History  Administered Date(s) Administered   Influenza,inj,Quad PF,6+ Mos 03/25/2019, 03/05/2020, 03/19/2021   PFIZER(Purple Top)SARS-COV-2 Vaccination 09/10/2019, 10/04/2019   Tdap 03/04/2019    Health  Maintenance  Topic Date Due   COVID-19 Vaccine (3 - Pfizer risk series) 11/01/2019   COLONOSCOPY (Pts 45-38yrs Insurance coverage will need to be confirmed)  04/12/2025   TETANUS/TDAP  03/03/2029   INFLUENZA VACCINE  Completed   Hepatitis C Screening  Completed   HIV Screening  Completed   HPV VACCINES  Aged Out    Discussed health benefits of physical activity, and encouraged him to engage in regular exercise appropriate for his age and condition.  Problem List Items Addressed This Visit       Cardiovascular and Mediastinum   Elevated blood pressure reading in office with diagnosis of hypertension    Weight gain noted since last appt Pt noted anxiety r/t pcp appts Recommend home checks Does not check at this time        Other   Annual physical exam - Primary    Things to do to keep yourself healthy  - Exercise at least 30-45 minutes a day, 3-4 days a week.  - Eat a low-fat diet with lots of fruits and vegetables, up to 7-9 servings per day.  - Seatbelts can save your life. Wear them always.  - Smoke detectors on every level of your home, check batteries every year.  - Eye Doctor - have an eye exam every 1-2 years  - Safe sex - if you may be exposed to STDs, use a condom.  - Alcohol -  If you drink, do it moderately, less than 2 drinks per day.  - [place]. Choose someone to speak for you if you are not able.  - Depression is common in our stressful world.If you're feeling down or losing interest in things you normally enjoy, please come in for a visit.  - Violence - If anyone is threatening or hurting you, please call immediately.        Relevant Orders   CBC with Differential/Platelet   Comprehensive metabolic panel   Hemoglobin A1c   TSH   PSA   Lipid panel   Avitaminosis D    On vitamin d therapy; continue lab checks annual for vit d need      Relevant Orders   Vitamin D (25 hydroxy)   Need for influenza vaccination    Provided today       Relevant Orders   Flu Vaccine QUAD 36+ mos IM (Fluarix/Fluzone) (Completed)   Chronic left-sided thoracic back pain    Recommend use of heat Referral for [facility] placed Not spinal; appears to be MSK No pinpoint tenderness- a pulling sensation with posture changes  Advised Ibuprofen OTC 800 mg q8 hrs, for 3 days if pain is continuous       Relevant Orders   Ambulatory referral to Orthopedic Surgery   Lipoma of abdominal wall    Under right breast Non tender      Lipoma of anterior chest wall    Large Lower, left flank Painful when left laying  Lipoma of left upper extremity    Small Left upper arm      Morbid obesity (HCC)    Weight gain, BMI 32 BP elevated Complaints of back pain        Return in about 1 year (around 03/19/2022) for annual examination.    Vonna Kotyk, FNP, have reviewed all documentation for this visit. The documentation on 03/19/21 for the exam, diagnosis, procedures, and orders are all accurate and complete.    Gwyneth Sprout, [place] [phone] (phone) [phone] (fax)  [place]

## 2021-03-19 NOTE — Assessment & Plan Note (Signed)
Provided today 

## 2021-03-19 NOTE — Assessment & Plan Note (Signed)
On vitamin d therapy; continue lab checks annual for vit d need

## 2021-03-19 NOTE — Assessment & Plan Note (Signed)
Recommend use of heat Referral for [facility] placed Not spinal; appears to be MSK No pinpoint tenderness- a pulling sensation with posture changes  Advised Ibuprofen OTC 800 mg q8 hrs, for 3 days if pain is continuous

## 2021-03-19 NOTE — Assessment & Plan Note (Signed)
Weight gain noted since last appt Pt noted anxiety r/t pcp appts Recommend home checks Does not check at this time

## 2021-03-19 NOTE — Assessment & Plan Note (Signed)
Large Lower, left flank Painful when left laying

## 2021-03-20 LAB — COMPREHENSIVE METABOLIC PANEL
ALT: 17 IU/L (ref 0–44)
AST: 8 IU/L (ref 0–40)
Albumin/Globulin Ratio: 2.2 (ref 1.2–2.2)
Albumin: 4.6 g/dL (ref 4.0–5.0)
Alkaline Phosphatase: 52 IU/L (ref 44–121)
BUN/Creatinine Ratio: 18 (ref 9–20)
BUN: 22 mg/dL (ref 6–24)
Bilirubin Total: 1.2 mg/dL (ref 0.0–1.2)
CO2: 23 mmol/L (ref 20–29)
Calcium: 9.4 mg/dL (ref 8.7–10.2)
Chloride: 105 mmol/L (ref 96–106)
Creatinine, Ser: 1.22 mg/dL (ref 0.76–1.27)
Globulin, Total: 2.1 g/dL (ref 1.5–4.5)
Glucose: 93 mg/dL (ref 70–99)
Potassium: 4.2 mmol/L (ref 3.5–5.2)
Sodium: 141 mmol/L (ref 134–144)
Total Protein: 6.7 g/dL (ref 6.0–8.5)
eGFR: 74 mL/min/{1.73_m2} (ref >59)

## 2021-03-20 LAB — LIPID PANEL
Chol/HDL Ratio: 3.7 ratio (ref 0.0–5.0)
Cholesterol, Total: 126 mg/dL (ref 100–199)
HDL: 34 mg/dL — ABNORMAL LOW (ref >39)
LDL Chol Calc (NIH): 77 mg/dL (ref 0–99)
Triglycerides: 77 mg/dL (ref 0–149)
VLDL Cholesterol Cal: 15 mg/dL (ref 5–40)

## 2021-03-20 LAB — CBC WITH DIFFERENTIAL/PLATELET
Basophils Absolute: 0 10*3/uL (ref 0.0–0.2)
Basos: 1 %
EOS (ABSOLUTE): 0.1 10*3/uL (ref 0.0–0.4)
Eos: 1 %
Hematocrit: 50.6 % (ref 37.5–51.0)
Hemoglobin: 16.7 g/dL (ref 13.0–17.7)
Immature Grans (Abs): 0 10*3/uL (ref 0.0–0.1)
Immature Granulocytes: 0 %
Lymphocytes Absolute: 1.5 10*3/uL (ref 0.7–3.1)
Lymphs: 30 %
MCH: 28.6 pg (ref 26.6–33.0)
MCHC: 33 g/dL (ref 31.5–35.7)
MCV: 87 fL (ref 79–97)
Monocytes Absolute: 0.4 10*3/uL (ref 0.1–0.9)
Monocytes: 7 %
Neutrophils Absolute: 3 10*3/uL (ref 1.4–7.0)
Neutrophils: 61 %
Platelets: 218 10*3/uL (ref 150–450)
RBC: 5.84 x10E6/uL — ABNORMAL HIGH (ref 4.14–5.80)
RDW: 13 % (ref 11.6–15.4)
WBC: 5 10*3/uL (ref 3.4–10.8)

## 2021-03-20 LAB — PSA: Prostate Specific Ag, Serum: 0.9 ng/mL (ref 0.0–4.0)

## 2021-03-20 LAB — HEMOGLOBIN A1C
Est. average glucose Bld gHb Est-mCnc: 105 mg/dL
Hgb A1c MFr Bld: 5.3 % (ref 4.8–5.6)

## 2021-03-20 LAB — VITAMIN D 25 HYDROXY (VIT D DEFICIENCY, FRACTURES): Vit D, 25-Hydroxy: 51 ng/mL (ref 30.0–100.0)

## 2021-03-20 LAB — TSH: TSH: 1.52 u[IU]/mL (ref 0.450–4.500)

## 2021-03-29 DIAGNOSIS — S56911A Strain of unspecified muscles, fascia and tendons at forearm level, right arm, initial encounter: Secondary | ICD-10-CM | POA: Diagnosis not present

## 2021-03-29 DIAGNOSIS — M546 Pain in thoracic spine: Secondary | ICD-10-CM | POA: Diagnosis not present

## 2023-05-14 DIAGNOSIS — Z20822 Contact with and (suspected) exposure to covid-19: Secondary | ICD-10-CM | POA: Diagnosis not present

## 2023-05-14 DIAGNOSIS — J069 Acute upper respiratory infection, unspecified: Secondary | ICD-10-CM | POA: Diagnosis not present

## 2023-05-14 DIAGNOSIS — M791 Myalgia, unspecified site: Secondary | ICD-10-CM | POA: Diagnosis not present

## 2023-05-22 DIAGNOSIS — J22 Unspecified acute lower respiratory infection: Secondary | ICD-10-CM | POA: Diagnosis not present

## 2023-06-19 DIAGNOSIS — J208 Acute bronchitis due to other specified organisms: Secondary | ICD-10-CM | POA: Diagnosis not present

## 2024-03-17 ENCOUNTER — Encounter: Payer: Self-pay | Admitting: Internal Medicine

## 2024-03-17 ENCOUNTER — Ambulatory Visit: Admitting: Internal Medicine

## 2024-03-17 VITALS — BP 150/90 | Ht 72.0 in | Wt 287.4 lb

## 2024-03-17 DIAGNOSIS — I10 Essential (primary) hypertension: Secondary | ICD-10-CM | POA: Diagnosis not present

## 2024-03-17 DIAGNOSIS — G8929 Other chronic pain: Secondary | ICD-10-CM

## 2024-03-17 DIAGNOSIS — E66812 Obesity, class 2: Secondary | ICD-10-CM

## 2024-03-17 DIAGNOSIS — R739 Hyperglycemia, unspecified: Secondary | ICD-10-CM

## 2024-03-17 DIAGNOSIS — Z87442 Personal history of urinary calculi: Secondary | ICD-10-CM

## 2024-03-17 DIAGNOSIS — M546 Pain in thoracic spine: Secondary | ICD-10-CM

## 2024-03-17 DIAGNOSIS — Z136 Encounter for screening for cardiovascular disorders: Secondary | ICD-10-CM | POA: Diagnosis not present

## 2024-03-17 DIAGNOSIS — Z6838 Body mass index (BMI) 38.0-38.9, adult: Secondary | ICD-10-CM

## 2024-03-17 MED ORDER — OLMESARTAN MEDOXOMIL 20 MG PO TABS: 20.0000 mg | ORAL_TABLET | Freq: Every day | ORAL | 0 refills | Status: DC

## 2024-03-17 NOTE — Patient Instructions (Signed)
 Hypertension, Adult Hypertension is another name for high blood pressure. High blood pressure forces your heart to work harder to pump blood. This can cause problems over time. There are two numbers in a blood pressure reading. There is a top number (systolic) over a bottom number (diastolic). It is best to have a blood pressure that is below 120/80. What are the causes? The cause of this condition is not known. Some other conditions can lead to high blood pressure. What increases the risk? Some lifestyle factors can make you more likely to develop high blood pressure: Smoking. Not getting enough exercise or physical activity. Being overweight. Having too much fat, sugar, calories, or salt (sodium) in your diet. Drinking too much alcohol. Other risk factors include: Having any of these conditions: Heart disease. Diabetes. High cholesterol. Kidney disease. Obstructive sleep apnea. Having a family history of high blood pressure and high cholesterol. Age. The risk increases with age. Stress. What are the signs or symptoms? High blood pressure may not cause symptoms. Very high blood pressure (hypertensive crisis) may cause: Headache. Fast or uneven heartbeats (palpitations). Shortness of breath. Nosebleed. Vomiting or feeling like you may vomit (nauseous). Changes in how you see. Very bad chest pain. Feeling dizzy. Seizures. How is this treated? This condition is treated by making healthy lifestyle changes, such as: Eating healthy foods. Exercising more. Drinking less alcohol. Your doctor may prescribe medicine if lifestyle changes do not help enough and if: Your top number is above 130. Your bottom number is above 80. Your personal target blood pressure may vary. Follow these instructions at home: Eating and drinking  If told, follow the DASH eating plan. To follow this plan: Fill one half of your plate at each meal with fruits and vegetables. Fill one fourth of your plate  at each meal with whole grains. Whole grains include whole-wheat pasta, brown rice, and whole-grain bread. Eat or drink low-fat dairy products, such as skim milk or low-fat yogurt. Fill one fourth of your plate at each meal with low-fat (lean) proteins. Low-fat proteins include fish, chicken without skin, eggs, beans, and tofu. Avoid fatty meat, cured and processed meat, or chicken with skin. Avoid pre-made or processed food. Limit the amount of salt in your diet to less than 1,500 mg each day. Do not drink alcohol if: Your doctor tells you not to drink. You are pregnant, may be pregnant, or are planning to become pregnant. If you drink alcohol: Limit how much you have to: 0-1 drink a day for women. 0-2 drinks a day for men. Know how much alcohol is in your drink. In the [place] one drink equals one 12 oz bottle of beer (355 mL), one 5 oz glass of wine (148 mL), or one 1 oz glass of hard liquor (44 mL). Lifestyle  Work with your doctor to stay at a healthy weight or to lose weight. Ask your doctor what the best weight is for you. Get at least 30 minutes of exercise that causes your heart to beat faster (aerobic exercise) most days of the week. This may include walking, swimming, or biking. Get at least 30 minutes of exercise that strengthens your muscles (resistance exercise) at least 3 days a week. This may include lifting weights or doing Pilates. Do not smoke or use any products that contain nicotine or tobacco. If you need help quitting, ask your doctor. Check your blood pressure at home as told by your doctor. Keep all follow-up visits. Medicines Take over-the-counter and prescription medicines  only as told by your doctor. Follow directions carefully. Do not skip doses of blood pressure medicine. The medicine does not work as well if you skip doses. Skipping doses also puts you at risk for problems. Ask your doctor about side effects or reactions to medicines that you should watch  for. Contact a doctor if: You think you are having a reaction to the medicine you are taking. You have headaches that keep coming back. You feel dizzy. You have swelling in your ankles. You have trouble with your vision. Get help right away if: You get a very bad headache. You start to feel mixed up (confused). You feel weak or numb. You feel faint. You have very bad pain in your: Chest. Belly (abdomen). You vomit more than once. You have trouble breathing. These symptoms may be an emergency. Get help right away. Call 911. Do not wait to see if the symptoms will go away. Do not drive yourself to the hospital. Summary Hypertension is another name for high blood pressure. High blood pressure forces your heart to work harder to pump blood. For most people, a normal blood pressure is less than 120/80. Making healthy choices can help lower blood pressure. If your blood pressure does not get lower with healthy choices, you may need to take medicine. This information is not intended to replace advice given to you by your health care provider. Make sure you discuss any questions you have with your health care provider. Document Revised: 03/28/2021 Document Reviewed: 03/28/2021 Elsevier Patient Education  2024 [employer].

## 2024-03-17 NOTE — Assessment & Plan Note (Signed)
 Encouraged diet and exercise for weight loss He will check insurance coverage on wegovy and zepbound

## 2024-03-17 NOTE — Assessment & Plan Note (Signed)
 Encouraged regular stretching and core strengthening Complicated by morbid obesity, encouraged weight loss as this can help reduce back pain Ok to continue tylenol and ibuprofen OTC as needed

## 2024-03-17 NOTE — Assessment & Plan Note (Signed)
 Complicated by morbid obesity Encouraged DASH diet and exercise for weight loss Will start olmesartan  20 mg, 1/2 tab daily x 5 days then 1 tab daily thereafter CMET today

## 2024-03-17 NOTE — Progress Notes (Signed)
 Subjective:    Patient ID: Derrick James, male    DOB: August 02, 1975, 48 y.o.   MRN: [id number]  HPI  Pt presents to the clinic today to establish care and for management of the conditions listed below.   History of kidney stones: He has not had a flare in the last few years. He does not follow with urology.  Chronic back pain: There is no imaging on file but he reports he had an xray in the past. He takes tylenol and ibuprofen OTC with some relief of symptoms. He does not follow with orthopedics.  HTN: His BP today is 152/98. He does not check his BP at home. He is not taking any antihypertensive medication at this time.   He would also like to discuss weight loss. His weight today is 287 lb with a BMI 38.98. He was going to direct primary care in [place] earlier this year, and lost 20 lbs using semaglutide. He has also been to [facility], which is a diet and exercise program. He reports he lost 70 lbs but he gained all of it back which he attributes to stress.  Review of Systems   Past Medical History:  Diagnosis Date   Chronic kidney disease    stones    Current Outpatient Medications  Medication Sig Dispense Refill   Cholecalciferol (VITAMIN D PO) Take by mouth.     No current facility-administered medications for this visit.    Allergies  Allergen Reactions   Codeine Nausea And Vomiting and Nausea Only   Fluticasone-Salmeterol     Other reaction(s): Headache   Promethazine     Other reaction(s): Dizziness    No family history on file.  Social History   Socioeconomic History   Marital status: Married    Spouse name: Not on file   Number of children: Not on file   Years of education: Not on file   Highest education level: Bachelor's degree (e.g., BA, AB, BS)  Occupational History   Not on file  Tobacco Use   Smoking status: Never   Smokeless tobacco: Never  Substance and Sexual Activity   Alcohol use: No   Drug use: No   Sexual activity:  Never  Other Topics Concern   Not on file  Social History Narrative   Not on file   Social Drivers of Health   Financial Resource Strain: Low Risk  (03/16/2024)   Overall Financial Resource Strain (CARDIA)    Difficulty of Paying Living Expenses: Not very hard  Food Insecurity: No Food Insecurity (03/16/2024)   Hunger Vital Sign    Worried About Running Out of Food in the Last Year: Never true    Ran Out of Food in the Last Year: Never true  Transportation Needs: No Transportation Needs (03/16/2024)   PRAPARE - Administrator, Civil Service (Medical): No    Lack of Transportation (Non-Medical): No  Physical Activity: Sufficiently Active (03/16/2024)   Exercise Vital Sign    Days of Exercise per Week: 3 days    Minutes of Exercise per Session: 60 min  Stress: No Stress Concern Present (03/16/2024)   [employer] of Occupational Health - Occupational Stress Questionnaire    Feeling of Stress: Only a little  Social Connections: Socially Integrated (03/16/2024)   Social Connection and Isolation Panel    Frequency of Communication with Friends and Family: Twice a week    Frequency of Social Gatherings with Friends and Family: Once a week  Attends Religious Services: More than 4 times per year    Active Member of Clubs or Organizations: Yes    Attends Banker Meetings: More than 4 times per year    Marital Status: Married  Catering manager Violence: Not on file     Constitutional: Denies fever, malaise, fatigue, headache or abrupt weight changes.  HEENT: Denies eye pain, eye redness, ear pain, ringing in the ears, wax buildup, runny nose, nasal congestion, bloody nose, or sore throat. Respiratory: Denies difficulty breathing, shortness of breath, cough or sputum production.   Cardiovascular: Denies chest pain, chest tightness, palpitations or swelling in the hands or feet.  Gastrointestinal: Denies abdominal pain, bloating, constipation, diarrhea or blood  in the stool.  GU: Denies urgency, frequency, pain with urination, burning sensation, blood in urine, odor or discharge. Musculoskeletal: Pt reports chronic back pain. Denies decrease in range of motion, difficulty with gait, muscle pain or joint swelling.  Skin: Denies redness, rashes, lesions or ulcercations.  Neurological: Denies dizziness, difficulty with memory, difficulty with speech or problems with balance and coordination.  Psych: Denies anxiety, depression, SI/HI.  No other specific complaints in a complete review of systems (except as listed in HPI above).      Objective:   Physical Exam  BP (!) 150/90   Ht 6' (1.829 m)   Wt 287 lb 6.4 oz (130.4 kg)   BMI 38.98 kg/m   Wt Readings from Last 3 Encounters:  03/19/21 237 lb (107.5 kg)  04/12/20 210 lb (95.3 kg)  03/05/20 215 lb 3.2 oz (97.6 kg)    General: Appears his stated age, obese, in NAD. Skin: Warm, dry and intact.  HEENT: Head: normal shape and size; Eyes: sclera white, no icterus, conjunctiva pink, PERRLA and EOMs intact;  Cardiovascular: Normal rate. Pulmonary/Chest: Normal effort. No respiratory distress.  Musculoskeletal: No difficulty with gait.  Neurological: Alert and oriented. Coordination normal.  Psychiatric: Mood and affect normal. Behavior is normal. Judgment and thought content normal.    BMET    Component Value Date/Time   NA 141 03/19/2021 1115   K 4.2 03/19/2021 1115   CL 105 03/19/2021 1115   CO2 23 03/19/2021 1115   GLUCOSE 93 03/19/2021 1115   BUN 22 03/19/2021 1115   CREATININE 1.22 03/19/2021 1115   CALCIUM 9.4 03/19/2021 1115   GFRNONAA 80 03/05/2020 1039   GFRAA 92 03/05/2020 1039    Lipid Panel     Component Value Date/Time   CHOL 126 03/19/2021 1115   TRIG 77 03/19/2021 1115   HDL 34 (L) 03/19/2021 1115   CHOLHDL 3.7 03/19/2021 1115   LDLCALC 77 03/19/2021 1115    CBC    Component Value Date/Time   WBC 5.0 03/19/2021 1115   RBC 5.84 (H) 03/19/2021 1115   HGB  16.7 03/19/2021 1115   HCT 50.6 03/19/2021 1115   PLT 218 03/19/2021 1115   MCV 87 03/19/2021 1115   MCH 28.6 03/19/2021 1115   MCHC 33.0 03/19/2021 1115   RDW 13.0 03/19/2021 1115   LYMPHSABS 1.5 03/19/2021 1115   EOSABS 0.1 03/19/2021 1115   BASOSABS 0.0 03/19/2021 1115    Hgb A1C Lab Results  Component Value Date   HGBA1C 5.3 03/19/2021            Assessment & Plan:   RTC in 2 weeks, followup HTN, 6 months for your annual exam Angeline Laura, NP

## 2024-03-17 NOTE — Assessment & Plan Note (Signed)
 Will monitor

## 2024-03-18 ENCOUNTER — Encounter: Payer: Self-pay | Admitting: Internal Medicine

## 2024-03-18 ENCOUNTER — Ambulatory Visit: Payer: Self-pay | Admitting: Internal Medicine

## 2024-03-18 LAB — CBC
HCT: 50.9 % — ABNORMAL HIGH (ref 38.5–50.0)
Hemoglobin: 16.9 g/dL (ref 13.2–17.1)
MCH: 28.8 pg (ref 27.0–33.0)
MCHC: 33.2 g/dL (ref 32.0–36.0)
MCV: 86.7 fL (ref 80.0–100.0)
MPV: 11.5 fL (ref 7.5–12.5)
Platelets: 247 Thousand/uL (ref 140–400)
RBC: 5.87 Million/uL — ABNORMAL HIGH (ref 4.20–5.80)
RDW: 13.8 % (ref 11.0–15.0)
WBC: 7.7 Thousand/uL (ref 3.8–10.8)

## 2024-03-18 LAB — COMPREHENSIVE METABOLIC PANEL WITH GFR
AG Ratio: 2.3 (calc) (ref 1.0–2.5)
ALT: 28 U/L (ref 9–46)
AST: 10 U/L (ref 10–40)
Albumin: 4.5 g/dL (ref 3.6–5.1)
Alkaline phosphatase (APISO): 51 U/L (ref 36–130)
BUN/Creatinine Ratio: 16 (calc) (ref 6–22)
BUN: 22 mg/dL (ref 7–25)
CO2: 28 mmol/L (ref 20–32)
Calcium: 9.4 mg/dL (ref 8.6–10.3)
Chloride: 106 mmol/L (ref 98–110)
Creat: 1.39 mg/dL — ABNORMAL HIGH (ref 0.60–1.29)
Globulin: 2 g/dL (ref 1.9–3.7)
Glucose, Bld: 101 mg/dL (ref 65–139)
Potassium: 4.2 mmol/L (ref 3.5–5.3)
Sodium: 143 mmol/L (ref 135–146)
Total Bilirubin: 0.7 mg/dL (ref 0.2–1.2)
Total Protein: 6.5 g/dL (ref 6.1–8.1)
eGFR: 62 mL/min/1.73m2 (ref >=60)

## 2024-03-18 LAB — LIPID PANEL
Cholesterol: 155 mg/dL (ref <200)
HDL: 35 mg/dL — ABNORMAL LOW (ref >=40)
LDL Cholesterol (Calc): 91 mg/dL
Non-HDL Cholesterol (Calc): 120 mg/dL (ref <130)
Total CHOL/HDL Ratio: 4.4 (calc) (ref <5.0)
Triglycerides: 193 mg/dL — ABNORMAL HIGH (ref <150)

## 2024-03-18 LAB — HEMOGLOBIN A1C
Hgb A1c MFr Bld: 5.4 % (ref <5.7)
Mean Plasma Glucose: 108 mg/dL
eAG (mmol/L): 6 mmol/L

## 2024-03-31 ENCOUNTER — Ambulatory Visit: Admitting: Internal Medicine

## 2024-03-31 ENCOUNTER — Encounter: Payer: Self-pay | Admitting: Internal Medicine

## 2024-03-31 VITALS — BP 138/84 | Ht 72.0 in | Wt 281.6 lb

## 2024-03-31 DIAGNOSIS — I10 Essential (primary) hypertension: Secondary | ICD-10-CM

## 2024-03-31 DIAGNOSIS — Z6838 Body mass index (BMI) 38.0-38.9, adult: Secondary | ICD-10-CM

## 2024-03-31 DIAGNOSIS — N529 Male erectile dysfunction, unspecified: Secondary | ICD-10-CM

## 2024-03-31 DIAGNOSIS — E66812 Obesity, class 2: Secondary | ICD-10-CM

## 2024-03-31 DIAGNOSIS — R5383 Other fatigue: Secondary | ICD-10-CM | POA: Diagnosis not present

## 2024-03-31 MED ORDER — OLMESARTAN MEDOXOMIL 20 MG PO TABS: 20.0000 mg | ORAL_TABLET | Freq: Every day | ORAL | 1 refills | Status: AC

## 2024-03-31 NOTE — Patient Instructions (Signed)
 Hypertension, Adult Hypertension is another name for high blood pressure. High blood pressure forces your heart to work harder to pump blood. This can cause problems over time. There are two numbers in a blood pressure reading. There is a top number (systolic) over a bottom number (diastolic). It is best to have a blood pressure that is below 120/80. What are the causes? The cause of this condition is not known. Some other conditions can lead to high blood pressure. What increases the risk? Some lifestyle factors can make you more likely to develop high blood pressure: Smoking. Not getting enough exercise or physical activity. Being overweight. Having too much fat, sugar, calories, or salt (sodium) in your diet. Drinking too much alcohol. Other risk factors include: Having any of these conditions: Heart disease. Diabetes. High cholesterol. Kidney disease. Obstructive sleep apnea. Having a family history of high blood pressure and high cholesterol. Age. The risk increases with age. Stress. What are the signs or symptoms? High blood pressure may not cause symptoms. Very high blood pressure (hypertensive crisis) may cause: Headache. Fast or uneven heartbeats (palpitations). Shortness of breath. Nosebleed. Vomiting or feeling like you may vomit (nauseous). Changes in how you see. Very bad chest pain. Feeling dizzy. Seizures. How is this treated? This condition is treated by making healthy lifestyle changes, such as: Eating healthy foods. Exercising more. Drinking less alcohol. Your doctor may prescribe medicine if lifestyle changes do not help enough and if: Your top number is above 130. Your bottom number is above 80. Your personal target blood pressure may vary. Follow these instructions at home: Eating and drinking  If told, follow the DASH eating plan. To follow this plan: Fill one half of your plate at each meal with fruits and vegetables. Fill one fourth of your plate  at each meal with whole grains. Whole grains include whole-wheat pasta, brown rice, and whole-grain bread. Eat or drink low-fat dairy products, such as skim milk or low-fat yogurt. Fill one fourth of your plate at each meal with low-fat (lean) proteins. Low-fat proteins include fish, chicken without skin, eggs, beans, and tofu. Avoid fatty meat, cured and processed meat, or chicken with skin. Avoid pre-made or processed food. Limit the amount of salt in your diet to less than 1,500 mg each day. Do not drink alcohol if: Your doctor tells you not to drink. You are pregnant, may be pregnant, or are planning to become pregnant. If you drink alcohol: Limit how much you have to: 0-1 drink a day for women. 0-2 drinks a day for men. Know how much alcohol is in your drink. In the [place] one drink equals one 12 oz bottle of beer (355 mL), one 5 oz glass of wine (148 mL), or one 1 oz glass of hard liquor (44 mL). Lifestyle  Work with your doctor to stay at a healthy weight or to lose weight. Ask your doctor what the best weight is for you. Get at least 30 minutes of exercise that causes your heart to beat faster (aerobic exercise) most days of the week. This may include walking, swimming, or biking. Get at least 30 minutes of exercise that strengthens your muscles (resistance exercise) at least 3 days a week. This may include lifting weights or doing Pilates. Do not smoke or use any products that contain nicotine or tobacco. If you need help quitting, ask your doctor. Check your blood pressure at home as told by your doctor. Keep all follow-up visits. Medicines Take over-the-counter and prescription medicines  only as told by your doctor. Follow directions carefully. Do not skip doses of blood pressure medicine. The medicine does not work as well if you skip doses. Skipping doses also puts you at risk for problems. Ask your doctor about side effects or reactions to medicines that you should watch  for. Contact a doctor if: You think you are having a reaction to the medicine you are taking. You have headaches that keep coming back. You feel dizzy. You have swelling in your ankles. You have trouble with your vision. Get help right away if: You get a very bad headache. You start to feel mixed up (confused). You feel weak or numb. You feel faint. You have very bad pain in your: Chest. Belly (abdomen). You vomit more than once. You have trouble breathing. These symptoms may be an emergency. Get help right away. Call 911. Do not wait to see if the symptoms will go away. Do not drive yourself to the hospital. Summary Hypertension is another name for high blood pressure. High blood pressure forces your heart to work harder to pump blood. For most people, a normal blood pressure is less than 120/80. Making healthy choices can help lower blood pressure. If your blood pressure does not get lower with healthy choices, you may need to take medicine. This information is not intended to replace advice given to you by your health care provider. Make sure you discuss any questions you have with your health care provider. Document Revised: 03/28/2021 Document Reviewed: 03/28/2021 Elsevier Patient Education  2024 [employer].

## 2024-03-31 NOTE — Progress Notes (Signed)
 Subjective:    Patient ID: Derrick James, male    DOB: 05/06/1976, 48 y.o.   MRN: [id number]  HPI  Discussed the use of AI scribe software for clinical note transcription with the patient, who gave verbal consent to proceed.  Derrick James is a 48 year old male with hypertension who presents for a follow-up on blood pressure management.  He started on a new blood pressure medication, olmesartan  20 mg daily, and initially felt 'different' on the first day but has since been fine. He has been making dietary changes and has lost six pounds in the past two weeks.  He inquired about testosterone levels due to concerns about decreased energy and libido.    Review of Systems   Past Medical History:  Diagnosis Date   Kidney stones     Current Outpatient Medications  Medication Sig Dispense Refill   Amino Acids (AMINO ACID PO) Take by mouth.     Cholecalciferol (VITAMIN D PO) Take by mouth.     Docusate Sodium (COLACE PO) Take by mouth.     olmesartan  (BENICAR ) 20 MG tablet Take 1 tablet (20 mg total) by mouth daily. 30 tablet 0   No current facility-administered medications for this visit.    Allergies  Allergen Reactions   Codeine Nausea And Vomiting and Nausea Only   Fluticasone-Salmeterol     Other reaction(s): Headache   Promethazine     Other reaction(s): Dizziness    Family History  Problem Relation Age of Onset   Cancer Mother    Hypertension Mother    Kidney disease Mother    Vision loss Mother    Diabetes Father    Hypertension Father    Stroke Father    Heart disease Maternal Grandfather    Arthritis Maternal Grandmother    Hypertension Paternal Grandfather    ADD / ADHD Son    Diabetes Paternal Uncle     Social History   Socioeconomic History   Marital status: Married    Spouse name: Not on file   Number of children: Not on file   Years of education: Not on file   Highest education level: Bachelor's degree (e.g., BA, AB, BS)   Occupational History   Not on file  Tobacco Use   Smoking status: Never   Smokeless tobacco: Never  Vaping Use   Vaping status: Never Used  Substance and Sexual Activity   Alcohol use: Not Currently   Drug use: Never   Sexual activity: Yes  Other Topics Concern   Not on file  Social History Narrative   Not on file   Social Drivers of Health   Financial Resource Strain: Low Risk  (03/16/2024)   Overall Financial Resource Strain (CARDIA)    Difficulty of Paying Living Expenses: Not very hard  Food Insecurity: No Food Insecurity (03/16/2024)   Hunger Vital Sign    Worried About Running Out of Food in the Last Year: Never true    Ran Out of Food in the Last Year: Never true  Transportation Needs: No Transportation Needs (03/16/2024)   PRAPARE - Administrator, Civil Service (Medical): No    Lack of Transportation (Non-Medical): No  Physical Activity: Sufficiently Active (03/16/2024)   Exercise Vital Sign    Days of Exercise per Week: 3 days    Minutes of Exercise per Session: 60 min  Stress: No Stress Concern Present (03/16/2024)   [employer] of Occupational Health - Occupational Stress Questionnaire  Feeling of Stress: Only a little  Social Connections: Socially Integrated (03/16/2024)   Social Connection and Isolation Panel    Frequency of Communication with Friends and Family: Twice a week    Frequency of Social Gatherings with Friends and Family: Once a week    Attends Religious Services: More than 4 times per year    Active Member of [employer] or Organizations: Yes    Attends Engineer, structural: More than 4 times per year    Marital Status: Married  Catering manager Violence: Not on file     Constitutional: Pt reports fatigue. Denies fever, malaise, headache or abrupt weight changes.  HEENT: Denies eye pain, eye redness, ear pain, ringing in the ears, wax buildup, runny nose, nasal congestion, bloody nose, or sore throat. Respiratory: Denies  difficulty breathing, shortness of breath, cough or sputum production.   Cardiovascular: Denies chest pain, chest tightness, palpitations or swelling in the hands or feet.  Gastrointestinal: Denies abdominal pain, bloating, constipation, diarrhea or blood in the stool.  GU: Pt reports erectile dysfunction. Denies urgency, frequency, pain with urination, burning sensation, blood in urine, odor or discharge. Musculoskeletal: Pt reports chronic back pain. Denies decrease in range of motion, difficulty with gait, muscle pain or joint swelling.  Skin: Denies redness, rashes, lesions or ulcercations.  Neurological: Denies dizziness, difficulty with memory, difficulty with speech or problems with balance and coordination.  Psych: Denies anxiety, depression, SI/HI.  No other specific complaints in a complete review of systems (except as listed in HPI above).      Objective:   Physical Exam  BP 138/84 (BP Location: Left Arm, Patient Position: Sitting, Cuff Size: Large)   Ht 6' (1.829 m)   Wt 281 lb 9.6 oz (127.7 kg)   BMI 38.19 kg/m    Wt Readings from Last 3 Encounters:  03/17/24 287 lb 6.4 oz (130.4 kg)  03/19/21 237 lb (107.5 kg)  04/12/20 210 lb (95.3 kg)    General: Appears his stated age, obese, in NAD. HEENT: Head: normal shape and size; Eyes: sclera white, no icterus, conjunctiva pink, PERRLA and EOMs intact;  Cardiovascular: Normal rate. Pulmonary/Chest: Normal effort. No respiratory distress.  Musculoskeletal: No difficulty with gait.  Neurological: Alert and oriented. Coordination normal.    BMET    Component Value Date/Time   NA 143 03/17/2024 1534   NA 141 03/19/2021 1115   K 4.2 03/17/2024 1534   CL 106 03/17/2024 1534   CO2 28 03/17/2024 1534   GLUCOSE 101 03/17/2024 1534   BUN 22 03/17/2024 1534   BUN 22 03/19/2021 1115   CREATININE 1.39 (H) 03/17/2024 1534   CALCIUM 9.4 03/17/2024 1534   GFRNONAA 80 03/05/2020 1039   GFRAA 92 03/05/2020 1039    Lipid  Panel     Component Value Date/Time   CHOL 155 03/17/2024 1534   CHOL 126 03/19/2021 1115   TRIG 193 (H) 03/17/2024 1534   HDL 35 (L) 03/17/2024 1534   HDL 34 (L) 03/19/2021 1115   CHOLHDL 4.4 03/17/2024 1534   LDLCALC 91 03/17/2024 1534    CBC    Component Value Date/Time   WBC 7.7 03/17/2024 1534   RBC 5.87 (H) 03/17/2024 1534   HGB 16.9 03/17/2024 1534   HGB 16.7 03/19/2021 1115   HCT 50.9 (H) 03/17/2024 1534   HCT 50.6 03/19/2021 1115   PLT 247 03/17/2024 1534   PLT 218 03/19/2021 1115   MCV 86.7 03/17/2024 1534   MCV 87 03/19/2021 1115  MCH 28.8 03/17/2024 1534   MCHC 33.2 03/17/2024 1534   RDW 13.8 03/17/2024 1534   RDW 13.0 03/19/2021 1115   LYMPHSABS 1.5 03/19/2021 1115   EOSABS 0.1 03/19/2021 1115   BASOSABS 0.0 03/19/2021 1115    Hgb A1C Lab Results  Component Value Date   HGBA1C 5.4 03/17/2024            Assessment & Plan:   Assessment and Plan     Fatigue and decreased libido Fatigue and decreased libido possibly related to low testosterone levels. Discussed testosterone level testing and potential referral to urologist if levels are low. - Order testosterone level test to be done before 10 AM. - Schedule lab appointment for testosterone testing next week. - Consider referral to a urologist if testosterone levels are low.       RTC in 5 months for your annual exam Angeline Laura, NP

## 2024-03-31 NOTE — Assessment & Plan Note (Signed)
 Encouraged diet and exercise for weight loss ?

## 2024-03-31 NOTE — Assessment & Plan Note (Signed)
 Complicated by morbid obesity Encouraged DASH diet and exercise for weight loss Controlled on olmesartan  20 mg daily

## 2024-04-05 ENCOUNTER — Other Ambulatory Visit

## 2024-04-05 DIAGNOSIS — R5383 Other fatigue: Secondary | ICD-10-CM

## 2024-04-05 DIAGNOSIS — N529 Male erectile dysfunction, unspecified: Secondary | ICD-10-CM

## 2024-04-28 ENCOUNTER — Encounter: Payer: Self-pay | Admitting: Internal Medicine

## 2024-09-20 ENCOUNTER — Encounter: Admitting: Internal Medicine
